# Patient Record
Sex: Female | Born: 1970 | Race: White | Hispanic: No | Marital: Married | Smoking: Never smoker
Health system: Southern US, Community
[De-identification: ages and names within clinical notes are randomized; demographics above are authoritative.]

## PROBLEM LIST (undated history)

## (undated) DIAGNOSIS — N870 Mild cervical dysplasia: Secondary | ICD-10-CM

## (undated) DIAGNOSIS — C50919 Malignant neoplasm of unspecified site of unspecified female breast: Secondary | ICD-10-CM

## (undated) DIAGNOSIS — E079 Disorder of thyroid, unspecified: Secondary | ICD-10-CM

## (undated) DIAGNOSIS — R7989 Other specified abnormal findings of blood chemistry: Secondary | ICD-10-CM

## (undated) DIAGNOSIS — T7840XA Allergy, unspecified, initial encounter: Secondary | ICD-10-CM

## (undated) DIAGNOSIS — Z923 Personal history of irradiation: Secondary | ICD-10-CM

## (undated) HISTORY — PX: FRACTURE SURGERY: SHX138

## (undated) HISTORY — PX: COLPOSCOPY: SHX161

## (undated) HISTORY — PX: OTHER SURGICAL HISTORY: SHX169

## (undated) HISTORY — PX: COLONOSCOPY: SHX174

## (undated) HISTORY — DX: Mild cervical dysplasia: N87.0

## (undated) HISTORY — DX: Disorder of thyroid, unspecified: E07.9

## (undated) HISTORY — PX: WISDOM TOOTH EXTRACTION: SHX21

## (undated) HISTORY — PX: BREAST BIOPSY: SHX20

## (undated) HISTORY — DX: Other specified abnormal findings of blood chemistry: R79.89

---

## 1998-12-31 ENCOUNTER — Inpatient Hospital Stay (HOSPITAL_COMMUNITY): Admission: AD | Admit: 1998-12-31 | Discharge: 1999-01-04 | Payer: Self-pay | Admitting: Obstetrics and Gynecology

## 2000-11-16 HISTORY — PX: ACETABULUM FRACTURE SURGERY: SHX541

## 2001-11-23 ENCOUNTER — Encounter: Admission: RE | Admit: 2001-11-23 | Discharge: 2001-12-16 | Payer: Self-pay | Admitting: Orthopedic Surgery

## 2002-03-02 ENCOUNTER — Other Ambulatory Visit: Admission: RE | Admit: 2002-03-02 | Discharge: 2002-03-02 | Payer: Self-pay | Admitting: Obstetrics and Gynecology

## 2002-03-31 ENCOUNTER — Ambulatory Visit (HOSPITAL_COMMUNITY)
Admission: RE | Admit: 2002-03-31 | Discharge: 2002-03-31 | Payer: Self-pay | Admitting: Physical Medicine and Rehabilitation

## 2003-03-20 ENCOUNTER — Other Ambulatory Visit: Admission: RE | Admit: 2003-03-20 | Discharge: 2003-03-20 | Payer: Self-pay | Admitting: Obstetrics & Gynecology

## 2004-03-24 ENCOUNTER — Other Ambulatory Visit: Admission: RE | Admit: 2004-03-24 | Discharge: 2004-03-24 | Payer: Self-pay | Admitting: Obstetrics & Gynecology

## 2007-06-15 DIAGNOSIS — N871 Moderate cervical dysplasia: Secondary | ICD-10-CM | POA: Insufficient documentation

## 2007-06-15 HISTORY — DX: Moderate cervical dysplasia: N87.1

## 2007-07-25 ENCOUNTER — Other Ambulatory Visit: Admission: RE | Admit: 2007-07-25 | Discharge: 2007-07-25 | Payer: Self-pay | Admitting: Obstetrics and Gynecology

## 2008-01-25 ENCOUNTER — Other Ambulatory Visit: Admission: RE | Admit: 2008-01-25 | Discharge: 2008-01-25 | Payer: Self-pay | Admitting: Obstetrics and Gynecology

## 2009-01-09 ENCOUNTER — Ambulatory Visit: Payer: Self-pay | Admitting: Obstetrics and Gynecology

## 2009-01-09 ENCOUNTER — Other Ambulatory Visit: Admission: RE | Admit: 2009-01-09 | Discharge: 2009-01-09 | Payer: Self-pay | Admitting: Obstetrics and Gynecology

## 2009-01-09 ENCOUNTER — Encounter: Payer: Self-pay | Admitting: Obstetrics and Gynecology

## 2009-01-16 ENCOUNTER — Ambulatory Visit: Payer: Self-pay | Admitting: Obstetrics and Gynecology

## 2009-02-27 ENCOUNTER — Ambulatory Visit: Payer: Self-pay | Admitting: Obstetrics and Gynecology

## 2009-12-25 ENCOUNTER — Ambulatory Visit: Payer: Self-pay | Admitting: Obstetrics and Gynecology

## 2010-01-03 ENCOUNTER — Ambulatory Visit: Payer: Self-pay | Admitting: Obstetrics and Gynecology

## 2010-01-13 ENCOUNTER — Ambulatory Visit: Payer: Self-pay | Admitting: Obstetrics and Gynecology

## 2010-01-13 ENCOUNTER — Other Ambulatory Visit: Admission: RE | Admit: 2010-01-13 | Discharge: 2010-01-13 | Payer: Self-pay | Admitting: Obstetrics and Gynecology

## 2010-04-17 ENCOUNTER — Ambulatory Visit (HOSPITAL_COMMUNITY): Admission: RE | Admit: 2010-04-17 | Discharge: 2010-04-17 | Payer: Self-pay | Admitting: Obstetrics and Gynecology

## 2011-01-19 ENCOUNTER — Encounter (INDEPENDENT_AMBULATORY_CARE_PROVIDER_SITE_OTHER): Payer: BC Managed Care – PPO | Admitting: Obstetrics and Gynecology

## 2011-01-19 ENCOUNTER — Other Ambulatory Visit: Payer: Self-pay | Admitting: Obstetrics and Gynecology

## 2011-01-19 ENCOUNTER — Other Ambulatory Visit (HOSPITAL_COMMUNITY)
Admission: RE | Admit: 2011-01-19 | Discharge: 2011-01-19 | Disposition: A | Discharge status: Home / Self Care | Payer: BC Managed Care – PPO | Source: Ambulatory Visit | Attending: Obstetrics and Gynecology | Admitting: Obstetrics and Gynecology

## 2011-01-19 DIAGNOSIS — Z01419 Encounter for gynecological examination (general) (routine) without abnormal findings: Secondary | ICD-10-CM

## 2011-01-19 DIAGNOSIS — Z13220 Encounter for screening for lipoid disorders: Secondary | ICD-10-CM

## 2011-01-19 DIAGNOSIS — Z124 Encounter for screening for malignant neoplasm of cervix: Secondary | ICD-10-CM | POA: Insufficient documentation

## 2012-01-19 ENCOUNTER — Encounter: Payer: Self-pay | Admitting: Gynecology

## 2012-01-19 DIAGNOSIS — N870 Mild cervical dysplasia: Secondary | ICD-10-CM | POA: Insufficient documentation

## 2012-01-26 ENCOUNTER — Other Ambulatory Visit (HOSPITAL_COMMUNITY)
Admission: RE | Admit: 2012-01-26 | Discharge: 2012-01-26 | Disposition: A | Discharge status: Home / Self Care | Payer: BC Managed Care – PPO | Source: Ambulatory Visit | Attending: Obstetrics and Gynecology | Admitting: Obstetrics and Gynecology

## 2012-01-26 ENCOUNTER — Encounter: Payer: Self-pay | Admitting: Obstetrics and Gynecology

## 2012-01-26 ENCOUNTER — Ambulatory Visit (INDEPENDENT_AMBULATORY_CARE_PROVIDER_SITE_OTHER): Payer: BC Managed Care – PPO | Admitting: Obstetrics and Gynecology

## 2012-01-26 VITALS — BP 120/80 | Ht 66.0 in | Wt 182.0 lb

## 2012-01-26 DIAGNOSIS — Z01419 Encounter for gynecological examination (general) (routine) without abnormal findings: Secondary | ICD-10-CM

## 2012-01-26 LAB — LIPID PANEL
Cholesterol: 193 mg/dL (ref 0–200)
HDL: 74 mg/dL (ref >39)
LDL Cholesterol: 104 mg/dL — ABNORMAL HIGH (ref 0–99)
Total CHOL/HDL Ratio: 2.6 Ratio
Triglycerides: 76 mg/dL (ref <150)
VLDL: 15 mg/dL (ref 0–40)

## 2012-01-26 MED ORDER — AMOXICILLIN 500 MG PO CAPS: 500.0000 mg | ORAL_CAPSULE | Freq: Two times a day (BID) | ORAL | Status: AC

## 2012-01-26 NOTE — Progress Notes (Addendum)
Patient came to see me today for her annual GYN exam. Her Paps have remained normal since her last colposcopy. She will have just an occasional hot flash. She has a Mirena IUD in his having extremely light spotting. She is due for followup mammogram. She is having no pelvic pain or other GYN symptoms. Last year her LDL was borderline at 127. She fasted today. Patient is complaining of sinusitis with a lot of drainage.  Physical examination: Kennon Portela present. HEENT within normal limits. Neck: Thyroid not large. No masses. Supraclavicular nodes: not enlarged. Breasts: Examined in both sitting midline position. No skin changes and no masses. Abdomen: Soft no guarding rebound or masses or hernia. Pelvic: External: Within normal limits. BUS: Within normal limits. Vaginal:within normal limits. Good estrogen effect. No evidence of cystocele rectocele or enterocele. Cervix: clean IUD string visible. Uterus: Normal size and shape. Adnexa: No masses. Rectovaginal exam: Confirmatory and negative. Extremities: Within normal limits.  Assessment: #1. CIN-1 #2. Very occasional hot flashes #3. Borderline LDL #4. Sinusitis  Plan: Mammogram. Reassured about hot flashes. Lipid  Profile. Amoxicillin 500 mg twice a day for 10 days(this usually works for patient)

## 2012-01-27 LAB — URINALYSIS W MICROSCOPIC + REFLEX CULTURE
Bacteria, UA: NONE SEEN
Bilirubin Urine: NEGATIVE
Casts: NONE SEEN
Crystals: NONE SEEN
Glucose, UA: NEGATIVE mg/dL
Hgb urine dipstick: NEGATIVE
Ketones, ur: NEGATIVE mg/dL
Leukocytes, UA: NEGATIVE
Nitrite: NEGATIVE
Protein, ur: NEGATIVE mg/dL
Specific Gravity, Urine: 1.012 (ref 1.005–1.030)
Squamous Epithelial / LPF: NONE SEEN
Urobilinogen, UA: 0.2 mg/dL (ref 0.0–1.0)
pH: 8 (ref 5.0–8.0)

## 2012-01-27 LAB — CBC WITH DIFFERENTIAL/PLATELET
Basophils Absolute: 0 10*3/uL (ref 0.0–0.1)
Basophils Relative: 0 % (ref 0–1)
Eosinophils Absolute: 0.1 10*3/uL (ref 0.0–0.7)
Eosinophils Relative: 2 % (ref 0–5)
HCT: 38.1 % (ref 36.0–46.0)
Hemoglobin: 12.4 g/dL (ref 12.0–15.0)
Lymphocytes Relative: 47 % — ABNORMAL HIGH (ref 12–46)
Lymphs Abs: 2.6 10*3/uL (ref 0.7–4.0)
MCH: 30.6 pg (ref 26.0–34.0)
MCHC: 32.5 g/dL (ref 30.0–36.0)
MCV: 94.1 fL (ref 78.0–100.0)
Monocytes Absolute: 0.4 10*3/uL (ref 0.1–1.0)
Monocytes Relative: 7 % (ref 3–12)
Neutro Abs: 2.4 10*3/uL (ref 1.7–7.7)
Neutrophils Relative %: 44 % (ref 43–77)
Platelets: 330 10*3/uL (ref 150–400)
RBC: 4.05 MIL/uL (ref 3.87–5.11)
RDW: 12.5 % (ref 11.5–15.5)
WBC: 5.5 10*3/uL (ref 4.0–10.5)

## 2012-02-16 ENCOUNTER — Encounter: Payer: Self-pay | Admitting: Internal Medicine

## 2012-03-09 ENCOUNTER — Encounter: Payer: Self-pay | Admitting: Internal Medicine

## 2012-03-10 ENCOUNTER — Encounter: Payer: Self-pay | Admitting: Internal Medicine

## 2012-03-10 ENCOUNTER — Ambulatory Visit (INDEPENDENT_AMBULATORY_CARE_PROVIDER_SITE_OTHER): Payer: BC Managed Care – PPO | Admitting: Internal Medicine

## 2012-03-10 VITALS — BP 110/70 | HR 72 | Ht 66.5 in | Wt 180.4 lb

## 2012-03-10 DIAGNOSIS — Z1211 Encounter for screening for malignant neoplasm of colon: Secondary | ICD-10-CM

## 2012-03-10 DIAGNOSIS — Z800 Family history of malignant neoplasm of digestive organs: Secondary | ICD-10-CM | POA: Insufficient documentation

## 2012-03-10 MED ORDER — PEG-KCL-NACL-NASULF-NA ASC-C 100 G PO SOLR: 1.0000 | Freq: Once | ORAL | Status: DC

## 2012-03-10 NOTE — Progress Notes (Signed)
Subjective:    Patient ID: Dominique Murphy, female    DOB: 1971-03-22, 41 y.o.   MRN: [id number]  HPI Dominique Murphy is a 41 yo female with PMH of CIN-1 who is seen in consultation at the request of Dr. Eda Paschal for evaluation of colorectal cancer screening given her family history of colon cancer. The patient is asymptomatic today from a GI standpoint. No abdominal pain, nausea, vomiting. No trouble with heartburn, dysphagia or odynophagia. No change in appetite. No weight loss. No change in bowel habits. No bright red blood per rectum or melena. No diarrhea or constipation.  Her father was diagnosed and treated for colorectal cancer at age 38. He underwent chemotherapy and a segmental colon resection. He is currently doing well. Her sister was subsequently screened and did not have polyps  Review of Systems As per history of present illness, otherwise negative  Past Medical History  Diagnosis Date  . CIN I (cervical intraepithelial neoplasia I)    Past Surgical History  Procedure Date  . Acetabulum fracture surgery   . Colposcopy   . Cesarean section    Current Outpatient Prescriptions  Medication Sig Dispense Refill  . levonorgestrel (MIRENA) 20 MCG/24HR IUD 1 each by Intrauterine route once.      . Multiple Vitamin (MULTIVITAMIN) tablet Take 1 tablet by mouth daily.      . peg 3350 powder (MOVIPREP) SOLR Take 1 kit (100 g total) by mouth once.  1 kit  0   Allergies  Allergen Reactions  . Morphine And Related   . Percocet (Oxycodone-Acetaminophen)    Family History  Problem Relation Age of Onset  . Colon cancer Father   . Heart disease Maternal Grandfather    History  Substance Use Topics  . Smoking status: Never Smoker   . Smokeless tobacco: Never Used  . Alcohol Use: 1.0 oz/week    2 drink(s) per week      Objective:   Physical Exam BP 110/70  Pulse 72  Ht 5' 6.5" (1.689 m)  Wt 180 lb 6.4 oz (81.829 kg)  BMI 28.68 kg/m2 Constitutional: Well-developed and  well-nourished. No distress. HEENT: Normocephalic and atraumatic. Oropharynx is clear and moist. No oropharyngeal exudate. Conjunctivae are normal. Pupils are equal round and reactive to light. No scleral icterus. Neck: Neck supple. Trachea midline. Cardiovascular: Normal rate, regular rhythm and intact distal pulses. No M/R/G Pulmonary/chest: Effort normal and breath sounds normal. No wheezing, rales or rhonchi. Abdominal: Soft, nontender, nondistended. Bowel sounds active throughout. There are no masses palpable. No hepatosplenomegaly. Extremities: no clubbing, cyanosis, or edema Lymphadenopathy: No cervical adenopathy noted. Neurological: Alert and oriented to person place and time. Skin: Skin is warm and dry. No rashes noted. Psychiatric: Normal mood and affect. Behavior is normal.  CBC    Component Value Date/Time   WBC 5.5 01/26/2012 1222   RBC 4.05 01/26/2012 1222   HGB 12.4 01/26/2012 1222   HCT 38.1 01/26/2012 1222   PLT 330 01/26/2012 1222   MCV 94.1 01/26/2012 1222   MCH 30.6 01/26/2012 1222   MCHC 32.5 01/26/2012 1222   RDW 12.5 01/26/2012 1222   LYMPHSABS 2.6 01/26/2012 1222   MONOABS 0.4 01/26/2012 1222   EOSABS 0.1 01/26/2012 1222   BASOSABS 0.0 01/26/2012 1222      Assessment & Plan:   41 yo female with PMH of CIN-1 who is seen in consultation at the request of Dr. Eda Paschal for evaluation of colorectal cancer screening given her family history of colon cancer.  1. High risk CRC screening/family hx of colon cancer in 1st degree relative at age 11 -- based on the patient's family history of colorectal cancer in a first-degree relative at age 54, current guidelines support beginning screening 10 years before this. For her this is age 69, which means now. We discussed the test in detail including the risks and benefits, along with the preparations, and she is agreeable to proceed. We will prep with Moviprep.  Repeat screening will be every 5 years based on family history, according  to current guidelines, unless findings of her colonoscopy dictate a shorter interval. Time provided for questions and answers, and she is satisfied with this plan.

## 2012-03-10 NOTE — Patient Instructions (Addendum)
You have been scheduled for a colonoscopy with propofol. Please follow written instructions given to you at your visit today.  Please pick up your prep kit at the pharmacy within the next 1-3 days.   We have sent the following medications to your pharmacy for you to pick up at your convenience: moviprep, you received instructions today at your visit

## 2012-03-14 ENCOUNTER — Telehealth: Payer: Self-pay | Admitting: *Deleted

## 2012-03-14 NOTE — Telephone Encounter (Signed)
PT INFORMED WITH RECENT LAB RESULTS. 01/26/12

## 2012-04-01 ENCOUNTER — Telehealth: Payer: Self-pay | Admitting: *Deleted

## 2012-04-01 MED ORDER — FLUCONAZOLE 150 MG PO TABS: 150.0000 mg | ORAL_TABLET | Freq: Every day | ORAL | Status: AC

## 2012-04-01 NOTE — Telephone Encounter (Signed)
(  pt of Dr.G) Pt calling requesting new Rx for diflucan, pt said she has refills but Rx expired. C/o itching, white discharge only. Pt would like 3 day dose of medication. Please advise

## 2012-04-01 NOTE — Telephone Encounter (Signed)
rx sent pt informed 

## 2012-04-01 NOTE — Telephone Encounter (Signed)
Okay please call in Diflucan 150 by mouth daily for 3 days #3 no refills office visit if no relief. (I did not see any orders for Diflucan in the past, if she took Diflucan 100 daily for 3 days please call in Diflucan 100 daily for 3 days #3).

## 2012-04-05 ENCOUNTER — Encounter: Payer: Self-pay | Admitting: Internal Medicine

## 2012-04-05 ENCOUNTER — Ambulatory Visit (AMBULATORY_SURGERY_CENTER): Payer: BC Managed Care – PPO | Admitting: Internal Medicine

## 2012-04-05 VITALS — BP 121/70 | HR 73 | Temp 96.8°F | Resp 15 | Ht 66.5 in | Wt 180.0 lb

## 2012-04-05 DIAGNOSIS — Z1211 Encounter for screening for malignant neoplasm of colon: Secondary | ICD-10-CM

## 2012-04-05 DIAGNOSIS — Z800 Family history of malignant neoplasm of digestive organs: Secondary | ICD-10-CM

## 2012-04-05 MED ORDER — SODIUM CHLORIDE 0.9 % IV SOLN: 500.0000 mL | INTRAVENOUS | Status: DC

## 2012-04-05 NOTE — Patient Instructions (Signed)
YOU HAD AN ENDOSCOPIC PROCEDURE TODAY AT THE [facility]: Refer to the procedure report that was given to you for any specific questions about what was found during the examination.  If the procedure report does not answer your questions, please call your gastroenterologist to clarify.  If you requested that your care partner not be given the details of your procedure findings, then the procedure report has been included in a sealed envelope for you to review at your convenience later.  YOU SHOULD EXPECT: Some feelings of bloating in the abdomen. Passage of more gas than usual.  Walking can help get rid of the air that was put into your GI tract during the procedure and reduce the bloating. If you had a lower endoscopy (such as a colonoscopy or flexible sigmoidoscopy) you may notice spotting of blood in your stool or on the toilet paper. If you underwent a bowel prep for your procedure, then you may not have a normal bowel movement for a few days.  DIET: Your first meal following the procedure should be a light meal and then it is ok to progress to your normal diet.  A half-sandwich or bowl of soup is an example of a good first meal.  Heavy or fried foods are harder to digest and may make you feel nauseous or bloated.  Likewise meals heavy in dairy and vegetables can cause extra gas to form and this can also increase the bloating.  Drink plenty of fluids but you should avoid alcoholic beverages for 24 hours.  ACTIVITY: Your care partner should take you home directly after the procedure.  You should plan to take it easy, moving slowly for the rest of the day.  You can resume normal activity the day after the procedure however you should NOT DRIVE or use heavy machinery for 24 hours (because of the sedation medicines used during the test).    SYMPTOMS TO REPORT IMMEDIATELY: A gastroenterologist can be reached at any hour.  During normal business hours, 8:30 AM to 5:00 PM Monday through Friday,  call [phone].  After hours and on weekends, please call the GI answering service at [phone] who will take a message and have the physician on call contact you.   Following lower endoscopy (colonoscopy or flexible sigmoidoscopy):  Excessive amounts of blood in the stool  Significant tenderness or worsening of abdominal pains  Swelling of the abdomen that is new, acute  Fever of 100F or higher    FOLLOW UP: If any biopsies were taken you will be contacted by phone or by letter within the next 1-3 weeks.  Call your gastroenterologist if you have not heard about the biopsies in 3 weeks.  Our staff will call the home number listed on your records the next business day following your procedure to check on you and address any questions or concerns that you may have at that time regarding the information given to you following your procedure. This is a courtesy call and so if there is no answer at the home number and we have not heard from you through the emergency physician on call, we will assume that you have returned to your regular daily activities without incident.  SIGNATURES/CONFIDENTIALITY: You and/or your care partner have signed paperwork which will be entered into your electronic medical record.  These signatures attest to the fact that that the information above on your After Visit Summary has been reviewed and is understood.  Full responsibility of the confidentiality   of this discharge information lies with you and/or your care-partner.     

## 2012-04-05 NOTE — Op Note (Signed)
[facility] [address] [place], [place]  [address]  COLONOSCOPY PROCEDURE REPORT  PATIENT:  Dominique Murphy, Dominique Murphy  MR#:  [id number] BIRTHDATE:  31-Oct-1971, 41 yrs. old  GENDER:  female ENDOSCOPIST:  Carie Caddy. Rutherford Alarie, MD REF. BY:  Catha Gosselin, M.D. PROCEDURE DATE:  04/05/2012 PROCEDURE:  Colonoscopy 65784 ASA CLASS:  Class I INDICATIONS:  Elevated Risk Screening, family history of colon cancer in father, age 42, 1st colonoscopy MEDICATIONS:   MAC sedation, administered by CRNA, propofol (Diprivan) 320 mg IV  DESCRIPTION OF PROCEDURE:   After the risks benefits and alternatives of the procedure were thoroughly explained, informed consent was obtained.  Digital rectal exam was performed and revealed no rectal masses.   The LB CF-[id number] endoscope was introduced through the anus and advanced to the terminal ileum which was intubated for a short distance, without limitations. The quality of the prep was Suprep good.  The instrument was then slowly withdrawn as the colon was fully examined. <<PROCEDUREIMAGES>>  FINDINGS:  The terminal ileum appeared normal.  A normal appearing cecum, ileocecal valve, and appendiceal orifice were identified. The ascending, hepatic flexure, transverse, splenic flexure, descending, sigmoid colon, and rectum appeared unremarkable. Retroflexed views in the rectum revealed no abnormalities.  The scope was then withdrawn from the cecum and the procedure completed.  COMPLICATIONS:  None  ENDOSCOPIC IMPRESSION: 1) Normal terminal ileum 2) Normal colon  RECOMMENDATIONS: 1) Given your significant family history of colon cancer, you should have a repeat colonoscopy in 5 years  Pinchus Weckwerth M. Rhea Belton, MD  CC:  Catha Gosselin, MD The Patient  n. eSIGNEDCarie Caddy. Amanda Pote at 04/05/2012 10:25 AM  Lenord Fellers, [phone]

## 2012-04-05 NOTE — Progress Notes (Signed)
Patient did not have preoperative order for IV antibiotic SSI prophylaxis. (G8918)Patient did not have preoperative order for IV antibiotic SSI prophylaxis. (G8918)Patient did not experience any of the following events: a burn prior to discharge; a fall within the facility; wrong site/side/patient/procedure/implant event; or a hospital transfer or hospital admission upon discharge from the facility. (G8907) 

## 2012-04-06 ENCOUNTER — Telehealth: Payer: Self-pay | Admitting: *Deleted

## 2012-04-06 NOTE — Telephone Encounter (Signed)
  Follow up Call-  Call back number 04/05/2012  Post procedure Call Back phone  # [phone]  Permission to leave phone message Yes     Patient questions:  Do you have a fever, pain , or abdominal swelling? no Pain Score  0 *  Have you tolerated food without any problems? yes  Have you been able to return to your normal activities? yes  Do you have any questions about your discharge instructions: Diet   no Medications  no Follow up visit  no  Do you have questions or concerns about your Care? no  Actions: * If pain score is 4 or above: No action needed, pain <4.

## 2012-05-11 ENCOUNTER — Telehealth: Payer: Self-pay | Admitting: *Deleted

## 2012-05-11 MED ORDER — ZOLPIDEM TARTRATE 10 MG PO TABS: 10.0000 mg | ORAL_TABLET | Freq: Every evening | ORAL | Status: DC | PRN

## 2012-05-11 NOTE — Addendum Note (Signed)
Addended by: Aura Camps on: 05/11/2012 04:21 PM   Modules accepted: Orders

## 2012-05-11 NOTE — Telephone Encounter (Signed)
Pt called requesting rx to help her sleep at night, she sleeps very light and her husband snores very loud. I asked pt about her pcp doctor and pt said that you normally write rx's for her. Please advise

## 2012-05-11 NOTE — Telephone Encounter (Signed)
Pt informed, rx called in 

## 2012-05-11 NOTE — Telephone Encounter (Signed)
Ambien 10 mg tablets, number 30. 1 at bedtime when necessary.

## 2012-07-04 ENCOUNTER — Other Ambulatory Visit: Payer: Self-pay | Admitting: *Deleted

## 2012-07-04 MED ORDER — ZOLPIDEM TARTRATE 10 MG PO TABS: 10.0000 mg | ORAL_TABLET | Freq: Every evening | ORAL | Status: DC | PRN

## 2012-09-09 ENCOUNTER — Other Ambulatory Visit: Payer: Self-pay | Admitting: Obstetrics and Gynecology

## 2012-09-12 NOTE — Telephone Encounter (Signed)
Rx called in to pharmacy. 

## 2012-12-07 ENCOUNTER — Other Ambulatory Visit: Payer: Self-pay | Admitting: Women's Health

## 2012-12-07 DIAGNOSIS — Z1231 Encounter for screening mammogram for malignant neoplasm of breast: Secondary | ICD-10-CM

## 2012-12-15 ENCOUNTER — Ambulatory Visit (HOSPITAL_COMMUNITY): Payer: BC Managed Care – PPO

## 2012-12-29 ENCOUNTER — Ambulatory Visit (HOSPITAL_COMMUNITY): Payer: BC Managed Care – PPO

## 2013-01-03 ENCOUNTER — Ambulatory Visit (HOSPITAL_COMMUNITY)
Admission: RE | Admit: 2013-01-03 | Discharge: 2013-01-03 | Disposition: A | Discharge status: Home / Self Care | Payer: BC Managed Care – PPO | Source: Ambulatory Visit | Attending: Women's Health | Admitting: Women's Health

## 2013-01-03 DIAGNOSIS — Z1231 Encounter for screening mammogram for malignant neoplasm of breast: Secondary | ICD-10-CM

## 2013-01-26 ENCOUNTER — Ambulatory Visit: Payer: BC Managed Care – PPO | Admitting: Women's Health

## 2013-01-26 ENCOUNTER — Encounter: Payer: Self-pay | Admitting: Women's Health

## 2013-01-26 ENCOUNTER — Other Ambulatory Visit (HOSPITAL_COMMUNITY)
Admission: RE | Admit: 2013-01-26 | Discharge: 2013-01-26 | Disposition: A | Discharge status: Home / Self Care | Payer: BC Managed Care – PPO | Source: Ambulatory Visit | Attending: Obstetrics and Gynecology | Admitting: Obstetrics and Gynecology

## 2013-01-26 VITALS — BP 116/70 | Ht 67.0 in | Wt 181.0 lb

## 2013-01-26 DIAGNOSIS — N870 Mild cervical dysplasia: Secondary | ICD-10-CM

## 2013-01-26 DIAGNOSIS — Z833 Family history of diabetes mellitus: Secondary | ICD-10-CM

## 2013-01-26 DIAGNOSIS — Z01419 Encounter for gynecological examination (general) (routine) without abnormal findings: Secondary | ICD-10-CM

## 2013-01-26 DIAGNOSIS — G4700 Insomnia, unspecified: Secondary | ICD-10-CM

## 2013-01-26 DIAGNOSIS — Z13220 Encounter for screening for lipoid disorders: Secondary | ICD-10-CM

## 2013-01-26 DIAGNOSIS — Z800 Family history of malignant neoplasm of digestive organs: Secondary | ICD-10-CM

## 2013-01-26 LAB — CBC WITH DIFFERENTIAL/PLATELET
Basophils Absolute: 0 10*3/uL (ref 0.0–0.1)
Basophils Relative: 0 % (ref 0–1)
Eosinophils Absolute: 0.1 10*3/uL (ref 0.0–0.7)
Eosinophils Relative: 1 % (ref 0–5)
HCT: 37.6 % (ref 36.0–46.0)
Hemoglobin: 12.6 g/dL (ref 12.0–15.0)
Lymphocytes Relative: 37 % (ref 12–46)
Lymphs Abs: 2.3 10*3/uL (ref 0.7–4.0)
MCH: 30.4 pg (ref 26.0–34.0)
MCHC: 33.5 g/dL (ref 30.0–36.0)
MCV: 90.8 fL (ref 78.0–100.0)
Monocytes Absolute: 0.6 10*3/uL (ref 0.1–1.0)
Monocytes Relative: 9 % (ref 3–12)
Neutro Abs: 3.3 10*3/uL (ref 1.7–7.7)
Neutrophils Relative %: 53 % (ref 43–77)
Platelets: 301 10*3/uL (ref 150–400)
RBC: 4.14 MIL/uL (ref 3.87–5.11)
RDW: 13.4 % (ref 11.5–15.5)
WBC: 6.3 10*3/uL (ref 4.0–10.5)

## 2013-01-26 MED ORDER — ZOLPIDEM TARTRATE 10 MG PO TABS: 10.0000 mg | ORAL_TABLET | Freq: Every evening | ORAL | Status: DC | PRN

## 2013-01-26 NOTE — Assessment & Plan Note (Signed)
Arline had negative colonoscopy 03/2012

## 2013-01-26 NOTE — Progress Notes (Signed)
Dominique Murphy October 04, 1971 [id number]    History:    The patient presents for annual exam.  Rare light cycle Mirena IUD placed 01/2009. CIN-1 on C&B 2008 with negative HR HPV, normal Paps after. Negative colonoscopy 03/2012. Father diagnosed with colon cancer age 43. Normal mammogram history.   Past medical history, past surgical history, family history and social history were all reviewed and documented in the EPIC chart. 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   ROS:  A  ROS was performed and pertinent positives and negatives are included in the history.  Exam:  Filed Vitals:   01/26/13 0931  BP: 116/70    General appearance:  Normal Head/Neck:  Normal, without cervical or supraclavicular adenopathy. Thyroid:  Symmetrical, normal in size, without palpable masses or nodularity. Respiratory  Effort:  Normal  Auscultation:  Clear without wheezing or rhonchi Cardiovascular  Auscultation:  Regular rate, without rubs, murmurs or gallops  Edema/varicosities:  Not grossly evident Abdominal  Soft,nontender, without masses, guarding or rebound.  Liver/spleen:  No organomegaly noted  Hernia:  None appreciated  Skin  Inspection:  Grossly normal  Palpation:  Grossly normal Neurologic/psychiatric  Orientation:  Normal with appropriate conversation.  Mood/affect:  Normal  Genitourinary    Breasts: Examined lying and sitting.     Right: Without masses, retractions, discharge or axillary adenopathy.     Left: Without masses, retractions, discharge or axillary adenopathy.   Inguinal/mons:  Normal without inguinal adenopathy  External genitalia:  Normal  BUS/Urethra/Skene's glands:  Normal  Bladder:  Normal  Vagina:  Normal  Cervix:  Normal IUD string visible  Uterus:   normal in size, shape and contour.  Midline and mobile  Adnexa/parametria:     Rt: Without masses or tenderness.   Lt: Without masses or tenderness.  Anus and perineum: Normal  Digital rectal exam: Normal  sphincter tone without palpated masses or tenderness  Assessment/Plan:  42 y.o. M. WF G1 P1 for annual exam with no complaints.   Mirena IUD 01/2009 CIN-1 2008 normal Paps after   Plan: SBE's, continue annual mammogram, calcium rich diet, vitamin D 1000 daily and increase regular exercise. CBC, glucose, lipid panel, UA, Pap. New screening guidelines reviewed. Aware Mirena IUD good for 5 years.    Harrington Challenger WHNP, 1:27 PM 01/26/2013

## 2013-01-26 NOTE — Patient Instructions (Addendum)

## 2013-01-27 ENCOUNTER — Encounter: Payer: Self-pay | Admitting: Obstetrics and Gynecology

## 2013-01-27 LAB — URINALYSIS W MICROSCOPIC + REFLEX CULTURE
Bacteria, UA: NONE SEEN
Bilirubin Urine: NEGATIVE
Casts: NONE SEEN
Crystals: NONE SEEN
Glucose, UA: NEGATIVE mg/dL
Hgb urine dipstick: NEGATIVE
Ketones, ur: NEGATIVE mg/dL
Leukocytes, UA: NEGATIVE
Nitrite: NEGATIVE
Protein, ur: NEGATIVE mg/dL
Specific Gravity, Urine: 1.029 (ref 1.005–1.030)
Squamous Epithelial / LPF: NONE SEEN
Urobilinogen, UA: 0.2 mg/dL (ref 0.0–1.0)
pH: 5.5 (ref 5.0–8.0)

## 2013-01-27 LAB — GLUCOSE, RANDOM: Glucose, Bld: 88 mg/dL (ref 70–99)

## 2013-01-27 LAB — LIPID PANEL
Cholesterol: 208 mg/dL — ABNORMAL HIGH (ref 0–200)
HDL: 77 mg/dL (ref >39)
LDL Cholesterol: 107 mg/dL — ABNORMAL HIGH (ref 0–99)
Total CHOL/HDL Ratio: 2.7 Ratio
Triglycerides: 118 mg/dL (ref <150)
VLDL: 24 mg/dL (ref 0–40)

## 2013-01-30 ENCOUNTER — Telehealth: Payer: Self-pay | Admitting: *Deleted

## 2013-01-30 NOTE — Telephone Encounter (Signed)
Prior authorization faxed to [employer],

## 2013-01-31 NOTE — Telephone Encounter (Signed)
Prior authorization was denied by [employer]. Pharmacy informed with this information.

## 2013-02-14 DIAGNOSIS — N870 Mild cervical dysplasia: Secondary | ICD-10-CM

## 2013-02-14 HISTORY — DX: Mild cervical dysplasia: N87.0

## 2013-03-01 ENCOUNTER — Ambulatory Visit (INDEPENDENT_AMBULATORY_CARE_PROVIDER_SITE_OTHER): Payer: BC Managed Care – PPO | Admitting: Gynecology

## 2013-03-01 ENCOUNTER — Other Ambulatory Visit: Payer: Self-pay | Admitting: Gynecology

## 2013-03-01 ENCOUNTER — Encounter: Payer: Self-pay | Admitting: Gynecology

## 2013-03-01 DIAGNOSIS — R6889 Other general symptoms and signs: Secondary | ICD-10-CM

## 2013-03-01 DIAGNOSIS — IMO0002 Reserved for concepts with insufficient information to code with codable children: Secondary | ICD-10-CM

## 2013-03-01 NOTE — Patient Instructions (Signed)
Office will call you with biopsy results 

## 2013-03-01 NOTE — Progress Notes (Signed)
Patient ID: Dominique Murphy, female   DOB: 1971-05-31, 42 y.o.   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 Was followed expectantly with normal Pap smears following. Most recent Pap smear at her annual exam 01/2013 showed LGSIL.  Exam with Selena Batten assistant External BUS vagina normal. Cervix grossly normal with IUD string visible, on the longer side. Uterus normal size midline mobile nontender. Adnexa without masses or tenderness.  Colposcopic evaluation, acetic acid cleanse inadequate with 2 patches of the acetowhite change off of the transformation zone 11 to 12:00.  TZ not seen.  Represent a biopsy at 12:00 taken with endocervical sample with brush taken separately. Physical Exam  Genitourinary:      Assessment and plan: LGSIL Pap smear, colposcopy isn't adequate with 2 areas of acetowhite change at 11 to 12:00. Represent a biopsy taken, endocervical sample taken. Patient will follow up for results. Had long discussion about dysplasia, high-grade/low-grade, progression/regression, HPV relationship. Plan expectant management if low grade or normal results. IUD string on the long side. Patient's asymptomatic without issues with intercourse. Light to absent menses.  Discussed trimming or not and we both agree on leaving this alone as she is asymptomatic from this. Followup for biopsy results.

## 2013-03-02 ENCOUNTER — Encounter: Payer: Self-pay | Admitting: Gynecology

## 2013-03-09 ENCOUNTER — Telehealth: Payer: Self-pay | Admitting: *Deleted

## 2013-03-09 MED ORDER — FLUCONAZOLE 150 MG PO TABS: 150.0000 mg | ORAL_TABLET | Freq: Once | ORAL | Status: DC

## 2013-03-09 NOTE — Telephone Encounter (Signed)
Okay to E. Scribe Diflucan 150 times one dose with 1 refill, instructed to call if problems.

## 2013-03-09 NOTE — Telephone Encounter (Signed)
Pt informed with the below note, rx sent. 

## 2013-03-09 NOTE — Telephone Encounter (Signed)
Pt called requesting a dose of diflucan, pt had oral surgery yesterday, taking antibody and when doing this yeast infection will occur. Okay to send Rx?

## 2013-08-02 ENCOUNTER — Other Ambulatory Visit: Payer: Self-pay | Admitting: Women's Health

## 2013-08-02 NOTE — Telephone Encounter (Signed)
Called into pharmacy

## 2013-09-21 ENCOUNTER — Other Ambulatory Visit: Payer: Self-pay

## 2013-11-22 ENCOUNTER — Other Ambulatory Visit: Payer: Self-pay | Admitting: Women's Health

## 2013-11-22 NOTE — Telephone Encounter (Signed)
Called into pharmacy

## 2013-12-13 ENCOUNTER — Other Ambulatory Visit: Payer: Self-pay | Admitting: Gynecology

## 2013-12-13 DIAGNOSIS — Z1231 Encounter for screening mammogram for malignant neoplasm of breast: Secondary | ICD-10-CM

## 2014-01-11 ENCOUNTER — Ambulatory Visit (HOSPITAL_COMMUNITY): Payer: BC Managed Care – PPO

## 2014-01-17 ENCOUNTER — Ambulatory Visit (HOSPITAL_COMMUNITY): Payer: BC Managed Care – PPO

## 2014-01-19 ENCOUNTER — Ambulatory Visit (HOSPITAL_COMMUNITY)
Admission: RE | Admit: 2014-01-19 | Discharge: 2014-01-19 | Disposition: A | Discharge status: Home / Self Care | Payer: BC Managed Care – PPO | Source: Ambulatory Visit | Attending: Gynecology | Admitting: Gynecology

## 2014-01-19 DIAGNOSIS — Z1231 Encounter for screening mammogram for malignant neoplasm of breast: Secondary | ICD-10-CM | POA: Insufficient documentation

## 2014-01-31 ENCOUNTER — Ambulatory Visit (INDEPENDENT_AMBULATORY_CARE_PROVIDER_SITE_OTHER): Payer: BC Managed Care – PPO | Admitting: Gynecology

## 2014-01-31 ENCOUNTER — Other Ambulatory Visit (HOSPITAL_COMMUNITY)
Admission: RE | Admit: 2014-01-31 | Discharge: 2014-01-31 | Disposition: A | Discharge status: Home / Self Care | Payer: BC Managed Care – PPO | Source: Ambulatory Visit | Attending: Gynecology | Admitting: Gynecology

## 2014-01-31 ENCOUNTER — Encounter: Payer: Self-pay | Admitting: Gynecology

## 2014-01-31 VITALS — BP 110/76 | Ht 66.0 in | Wt 188.0 lb

## 2014-01-31 DIAGNOSIS — IMO0002 Reserved for concepts with insufficient information to code with codable children: Secondary | ICD-10-CM

## 2014-01-31 DIAGNOSIS — N898 Other specified noninflammatory disorders of vagina: Secondary | ICD-10-CM

## 2014-01-31 DIAGNOSIS — Z30431 Encounter for routine checking of intrauterine contraceptive device: Secondary | ICD-10-CM

## 2014-01-31 DIAGNOSIS — R6889 Other general symptoms and signs: Secondary | ICD-10-CM

## 2014-01-31 DIAGNOSIS — Z1151 Encounter for screening for human papillomavirus (HPV): Secondary | ICD-10-CM | POA: Insufficient documentation

## 2014-01-31 DIAGNOSIS — N899 Noninflammatory disorder of vagina, unspecified: Secondary | ICD-10-CM

## 2014-01-31 DIAGNOSIS — Z124 Encounter for screening for malignant neoplasm of cervix: Secondary | ICD-10-CM | POA: Insufficient documentation

## 2014-01-31 DIAGNOSIS — Z01419 Encounter for gynecological examination (general) (routine) without abnormal findings: Secondary | ICD-10-CM

## 2014-01-31 LAB — WET PREP FOR TRICH, YEAST, CLUE
Trich, Wet Prep: NONE SEEN
Yeast Wet Prep HPF POC: NONE SEEN

## 2014-01-31 LAB — COMPREHENSIVE METABOLIC PANEL
ALT: 19 U/L (ref 0–35)
AST: 26 U/L (ref 0–37)
Albumin: 4.4 g/dL (ref 3.5–5.2)
Alkaline Phosphatase: 66 U/L (ref 39–117)
BUN: 18 mg/dL (ref 6–23)
CO2: 27 mEq/L (ref 19–32)
Calcium: 9.2 mg/dL (ref 8.4–10.5)
Chloride: 103 mEq/L (ref 96–112)
Creat: 0.76 mg/dL (ref 0.50–1.10)
Glucose, Bld: 94 mg/dL (ref 70–99)
Potassium: 4.4 mEq/L (ref 3.5–5.3)
Sodium: 138 mEq/L (ref 135–145)
Total Bilirubin: 0.3 mg/dL (ref 0.2–1.2)
Total Protein: 7 g/dL (ref 6.0–8.3)

## 2014-01-31 LAB — LIPID PANEL
Cholesterol: 210 mg/dL — ABNORMAL HIGH (ref 0–200)
HDL: 72 mg/dL (ref >39)
LDL Cholesterol: 128 mg/dL — ABNORMAL HIGH (ref 0–99)
Total CHOL/HDL Ratio: 2.9 Ratio
Triglycerides: 51 mg/dL (ref <150)
VLDL: 10 mg/dL (ref 0–40)

## 2014-01-31 MED ORDER — TINIDAZOLE 500 MG PO TABS: 2.0000 g | ORAL_TABLET | Freq: Once | ORAL | Status: DC

## 2014-01-31 NOTE — Progress Notes (Signed)
Dominique Murphy [birth year] [id number]        43 y.o.  G1P1001 for annual exam.  Several issues noted below.  Past medical history,surgical history, problem list, medications, allergies, family history and social history were all reviewed and documented in the EPIC chart.  ROS:  Performed and pertinent positives and negatives are included in the history, assessment and plan .  Exam: Kim assistant Filed Vitals:   01/31/14 1011  BP: 110/76  Height: 5\' 6"  (1.676 m)  Weight: 188 lb (85.276 kg)   General appearance  Normal Skin grossly normal Head/Neck normal with no cervical or supraclavicular adenopathy thyroid normal Lungs  clear Cardiac RR, without RMG Abdominal  soft, nontender, without masses, organomegaly or hernia Breasts  examined lying and sitting without masses, retractions, discharge or axillary adenopathy. Pelvic  Ext/BUS/vagina normal  Cervix normal with IUD string visualized. Pap/HPV done  Uterus anteverted, normal size, shape and contour, midline and mobile nontender   Adnexa  Without masses or tenderness    Anus and perineum  Normal   Rectovaginal  Normal sphincter tone without palpated masses or tenderness.    Assessment/Plan:  43 y.o. G77P1001 female for annual exam amenorrheic, Mirena IUD.   1. Mirena IUD 01/2009. Due to be replaced now and patient is to schedule appointment to do so. I did review all contraceptive options with her to include pill patch ring nexplanon Depo-Provera IUDs and sterilization. The pros/cons, risks/benefits of each choice reviewed. Patient wants to replace her IUD and will schedule ASAP. 2. 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  Does have history of LGSIL 2008, followed expectantly with normal Pap smears in the intervening time. Pap/HPV done today. Triage based upon these results. 3. Mild vaginal irritation and odor. No significant discharge. Wet prep is negative. She took a Diflucan she had at home but did not seem to help. We'll cover  for low-grade bacterial vaginosis with Tindamax 2 g daily x2 days. Alcohol avoidance reviewed. Followup if symptoms persist or recur. 4. Mammography 01/2014. Continue with annual mammography. SBE monthly reviewed. 5. Colonoscopy 2013 noted. 6. Health maintenance. Baseline CBC comprehensive metabolic panel lipid profile urinalysis ordered. Followup for IUD replacement appointment and Pap smear results.   Note: This document was prepared with digital dictation and possible smart phrase technology. Any transcriptional errors that result from this process are unintentional.   Anastasio Auerbach MD, 11:00 AM 01/31/2014

## 2014-01-31 NOTE — Patient Instructions (Signed)
Followup for IUD replacement appointment.  You may obtain a copy of any labs that were done today by logging onto MyChart as outlined in the instructions provided with your AVS (after visit summary). The office will not call with normal lab results but certainly if there are any significant abnormalities then we will contact you.   Health Maintenance, Female A healthy lifestyle and preventative care can promote health and wellness.  Maintain regular health, dental, and eye exams.  Eat a healthy diet. Foods like vegetables, fruits, whole grains, low-fat dairy products, and lean protein foods contain the nutrients you need without too many calories. Decrease your intake of foods high in solid fats, added sugars, and salt. Get information about a proper diet from your caregiver, if necessary.  Regular physical exercise is one of the most important things you can do for your health. Most adults should get at least 150 minutes of moderate-intensity exercise (any activity that increases your heart rate and causes you to sweat) each week. In addition, most adults need muscle-strengthening exercises on 2 or more days a week.   Maintain a healthy weight. The body mass index (BMI) is a screening tool to identify possible weight problems. It provides an estimate of body fat based on height and weight. Your caregiver can help determine your BMI, and can help you achieve or maintain a healthy weight. For adults 20 years and older:  A BMI below 18.5 is considered underweight.  A BMI of 18.5 to 24.9 is normal.  A BMI of 25 to 29.9 is considered overweight.  A BMI of 30 and above is considered obese.  Maintain normal blood lipids and cholesterol by exercising and minimizing your intake of saturated fat. Eat a balanced diet with plenty of fruits and vegetables. Blood tests for lipids and cholesterol should begin at age 24 and be repeated every 5 years. If your lipid or cholesterol levels are high, you are  over 50, or you are a high risk for heart disease, you may need your cholesterol levels checked more frequently.Ongoing high lipid and cholesterol levels should be treated with medicines if diet and exercise are not effective.  If you smoke, find out from your caregiver how to quit. If you do not use tobacco, do not start.  Lung cancer screening is recommended for adults aged 60 80 years who are at high risk for developing lung cancer because of a history of smoking. Yearly low-dose computed tomography (CT) is recommended for people who have at least a 30-pack-year history of smoking and are a current smoker or have quit within the past 15 years. A pack year of smoking is smoking an average of 1 pack of cigarettes a day for 1 year (for example: 1 pack a day for 30 years or 2 packs a day for 15 years). Yearly screening should continue until the smoker has stopped smoking for at least 15 years. Yearly screening should also be stopped for people who develop a health problem that would prevent them from having lung cancer treatment.  If you are pregnant, do not drink alcohol. If you are breastfeeding, be very cautious about drinking alcohol. If you are not pregnant and choose to drink alcohol, do not exceed 1 drink per day. One drink is considered to be 12 ounces (355 mL) of beer, 5 ounces (148 mL) of wine, or 1.5 ounces (44 mL) of liquor.  Avoid use of street drugs. Do not share needles with anyone. Ask for help if you  need support or instructions about stopping the use of drugs.  High blood pressure causes heart disease and increases the risk of stroke. Blood pressure should be checked at least every 1 to 2 years. Ongoing high blood pressure should be treated with medicines, if weight loss and exercise are not effective.  If you are 82 to 44 years old, ask your caregiver if you should take aspirin to prevent strokes.  Diabetes screening involves taking a blood sample to check your fasting blood sugar  level. This should be done once every 3 years, after age 75, if you are within normal weight and without risk factors for diabetes. Testing should be considered at a younger age or be carried out more frequently if you are overweight and have at least 1 risk factor for diabetes.  Breast cancer screening is essential preventative care for women. You should practice "breast self-awareness." This means understanding the normal appearance and feel of your breasts and may include breast self-examination. Any changes detected, no matter how small, should be reported to a caregiver. Women in their 53s and 30s should have a clinical breast exam (CBE) by a caregiver as part of a regular health exam every 1 to 3 years. After age 50, women should have a CBE every year. Starting at age 49, women should consider having a mammogram (breast X-ray) every year. Women who have a family history of breast cancer should talk to their caregiver about genetic screening. Women at a high risk of breast cancer should talk to their caregiver about having an MRI and a mammogram every year.  Breast cancer gene (BRCA)-related cancer risk assessment is recommended for women who have family members with BRCA-related cancers. BRCA-related cancers include breast, ovarian, tubal, and peritoneal cancers. Having family members with these cancers may be associated with an increased risk for harmful changes (mutations) in the breast cancer genes BRCA1 and BRCA2. Results of the assessment will determine the need for genetic counseling and BRCA1 and BRCA2 testing.  The Pap test is a screening test for cervical cancer. Women should have a Pap test starting at age 64. Between ages 15 and 25, Pap tests should be repeated every 2 years. Beginning at age 25, you should have a Pap test every 3 years as long as the past 3 Pap tests have been normal. If you had a hysterectomy for a problem that was not cancer or a condition that could lead to cancer, then  you no longer need Pap tests. If you are between ages 58 and 31, and you have had normal Pap tests going back 10 years, you no longer need Pap tests. If you have had past treatment for cervical cancer or a condition that could lead to cancer, you need Pap tests and screening for cancer for at least 20 years after your treatment. If Pap tests have been discontinued, risk factors (such as a new sexual partner) need to be reassessed to determine if screening should be resumed. Some women have medical problems that increase the chance of getting cervical cancer. In these cases, your caregiver may recommend more frequent screening and Pap tests.  The human papillomavirus (HPV) test is an additional test that may be used for cervical cancer screening. The HPV test looks for the virus that can cause the cell changes on the cervix. The cells collected during the Pap test can be tested for HPV. The HPV test could be used to screen women aged 47 years and older, and should be  used in women of any age who have unclear Pap test results. After the age of 84, women should have HPV testing at the same frequency as a Pap test.  Colorectal cancer can be detected and often prevented. Most routine colorectal cancer screening begins at the age of 53 and continues through age 45. However, your caregiver may recommend screening at an earlier age if you have risk factors for colon cancer. On a yearly basis, your caregiver may provide home test kits to check for hidden blood in the stool. Use of a small camera at the end of a tube, to directly examine the colon (sigmoidoscopy or colonoscopy), can detect the earliest forms of colorectal cancer. Talk to your caregiver about this at age 40, when routine screening begins. Direct examination of the colon should be repeated every 5 to 10 years through age 86, unless early forms of pre-cancerous polyps or small growths are found.  Hepatitis C blood testing is recommended for all people born  from [birth year] through 1966 and any individual with known risks for hepatitis C.  Practice safe sex. Use condoms and avoid high-risk sexual practices to reduce the spread of sexually transmitted infections (STIs). Sexually active women aged 70 and younger should be checked for Chlamydia, which is a common sexually transmitted infection. Older women with new or multiple partners should also be tested for Chlamydia. Testing for other STIs is recommended if you are sexually active and at increased risk.  Osteoporosis is a disease in which the bones lose minerals and strength with aging. This can result in serious bone fractures. The risk of osteoporosis can be identified using a bone density scan. Women ages 56 and over and women at risk for fractures or osteoporosis should discuss screening with their caregivers. Ask your caregiver whether you should be taking a calcium supplement or vitamin D to reduce the rate of osteoporosis.  Menopause can be associated with physical symptoms and risks. Hormone replacement therapy is available to decrease symptoms and risks. You should talk to your caregiver about whether hormone replacement therapy is right for you.  Use sunscreen. Apply sunscreen liberally and repeatedly throughout the day. You should seek shade when your shadow is shorter than you. Protect yourself by wearing long sleeves, pants, a wide-brimmed hat, and sunglasses year round, whenever you are outdoors.  Notify your caregiver of new moles or changes in moles, especially if there is a change in shape or color. Also notify your caregiver if a mole is larger than the size of a pencil eraser.  Stay current with your immunizations. Document Released: 05/18/2011 Document Revised: 02/27/2013 Document Reviewed: 05/18/2011 [facility] Patient Information 2014 [place].

## 2014-02-01 ENCOUNTER — Encounter: Payer: Self-pay | Admitting: Gynecology

## 2014-02-01 LAB — URINALYSIS W MICROSCOPIC + REFLEX CULTURE
Bacteria, UA: NONE SEEN
Bilirubin Urine: NEGATIVE
Casts: NONE SEEN
Crystals: NONE SEEN
Glucose, UA: NEGATIVE mg/dL
Hgb urine dipstick: NEGATIVE
Ketones, ur: NEGATIVE mg/dL
Leukocytes, UA: NEGATIVE
Nitrite: NEGATIVE
Protein, ur: NEGATIVE mg/dL
Specific Gravity, Urine: 1.016 (ref 1.005–1.030)
Squamous Epithelial / LPF: NONE SEEN
Urobilinogen, UA: 0.2 mg/dL (ref 0.0–1.0)
pH: 5.5 (ref 5.0–8.0)

## 2014-02-02 ENCOUNTER — Telehealth: Payer: Self-pay

## 2014-02-02 NOTE — Telephone Encounter (Signed)
I contacted patient and she does feel like she needs some help with this so I provided her with Dominique Murphy's name/ph # so she can call for appt. She said she is coming back in to see Dr. Loetta Rough 02/20/14 and will mention this then.

## 2014-02-02 NOTE — Telephone Encounter (Signed)
Patient responded to her online after visit survey:  -How are you feeling after your recent visit? Fine   -Does the recommended course of treatment seem to be helping your symptoms? Just 1.5 days in. Hoping meds work  -Are you experiencing any side effects from your recommended treatment? No  -Is there anything else you would like to ask your physician? Yes. Forgot to mention my binge eating issues which may explain weight gso too. I don't know why this is happening. It's like I know I shouldn't. But I do and can't control it. :(   Please see patient's question on last one.

## 2014-02-02 NOTE — Telephone Encounter (Signed)
Check with her and see if this is a real issue. If so then she can be referred to counseling if she would like such as Marya Amsler at [employer] [facility]

## 2014-02-12 ENCOUNTER — Other Ambulatory Visit: Payer: Self-pay | Admitting: Gynecology

## 2014-02-12 ENCOUNTER — Telehealth: Payer: Self-pay | Admitting: Gynecology

## 2014-02-12 DIAGNOSIS — Z3049 Encounter for surveillance of other contraceptives: Secondary | ICD-10-CM

## 2014-02-12 MED ORDER — LEVONORGESTREL 20 MCG/24HR IU IUD: INTRAUTERINE_SYSTEM | Freq: Once | INTRAUTERINE | Status: DC

## 2014-02-12 NOTE — Telephone Encounter (Signed)
02/12/14-Pt was informed today that her [facility] ins will cover the removal of existing mIrena and insertion of new at 100%, no copay. Appt set for 02/20/14.[facility]

## 2014-02-20 ENCOUNTER — Encounter: Payer: Self-pay | Admitting: Gynecology

## 2014-02-20 ENCOUNTER — Ambulatory Visit (INDEPENDENT_AMBULATORY_CARE_PROVIDER_SITE_OTHER): Payer: BC Managed Care – PPO | Admitting: Gynecology

## 2014-02-20 DIAGNOSIS — Z30432 Encounter for removal of intrauterine contraceptive device: Secondary | ICD-10-CM

## 2014-02-20 DIAGNOSIS — Z30430 Encounter for insertion of intrauterine contraceptive device: Secondary | ICD-10-CM

## 2014-02-20 NOTE — Progress Notes (Signed)
Patient presents for Mirena IUD removal and replacement. She has read through the booklet, has no contraindications and signed the consent form.  I reviewed the removal and insertional process with her as well as the risks to include infection, either immediate or long-term, uterine perforation or migration requiring surgery to remove and possibility of failure with subsequent pregnancy.   The patient does request a paracervical block and she had a very bad experience with her last IUD placement causing a lot of discomfort and pain.  Exam with Kim assistant Pelvic: External BUS vagina normal. Cervix normal IUD strings visualized. Uterus anteverted normal size shape contour midline mobile nontender. Adnexa without masses or tenderness.  Procedure: The cervix was visualized with a speculum and a paracervical block using 1% lidocaine was placed, a total of 8 cc. The IUD strings were grasped and the [facility] forcep in her old Mirena IUD was removed, shown to her and discarded. The cervix was then cleansed with Betadine, anterior lip grasped with a single-tooth tenaculum, the uterus was sounded and a Mirena IUD was placed according to manufacturer's recommendations without difficulty. The strings were trimmed. The patient tolerated well and will follow up in one month for a postinsertional check.  Lot number:  [id number]  Note: This document was prepared with digital dictation and possible smart phrase technology. Any transcriptional errors that result from this process are unintentional.  Anastasio Auerbach MD, 4:32 PM 02/20/2014

## 2014-02-20 NOTE — Patient Instructions (Signed)
Intrauterine Device Insertion   Most often, an intrauterine device (IUD) is inserted into the uterus to prevent pregnancy. There are 2 types of IUDs available:  · Copper IUD This type of IUD creates an environment that is not favorable to sperm survival. The mechanism of action of the copper IUD is not known for certain. It can stay in place for 10 years.  · Hormone IUD This type of IUD contains the hormone progestin (synthetic progesterone). The progestin thickens the cervical mucus and prevents sperm from entering the uterus, and it also thins the uterine lining. There is no evidence that the hormone IUD prevents implantation. One hormone IUD can stay in place for up to 5 years, and a different hormone IUD can stay in place for up to 3 years.  An IUD is the most cost-effective birth control if left in place for the full duration. It may be removed at any time.  LET YOUR HEALTH CARE PROVIDER KNOW ABOUT:  · Any allergies you have.  · All medicines you are taking, including vitamins, herbs, eye drops, creams, and over-the-counter medicines.  · Previous problems you or members of your family have had with the use of anesthetics.  · Any blood disorders you have.  · Previous surgeries you have had.  · Possibility of pregnancy.  · Medical conditions you have.  RISKS AND COMPLICATIONS   Generally, intrauterine device insertion is a safe procedure. However, as with any procedure, complications can occur. Possible complications include:  · Accidental puncture (perforation) of the uterus.  · Accidental placement of the IUD either in the muscle layer of the uterus (myometrium) or outside the uterus. If this happens, the IUD can be found essentially floating around the bowels and must be taken out surgically.  · The IUD may fall out of the uterus (expulsion). This is more common in women who have recently had a child.    · Pregnancy in the fallopian tube (ectopic).  · Pelvic inflammatory disease (PID), which is infection of  the uterus and fallopian tubes. The risk of PID is slightly increased in the first 20 days after the IUD is placed, but the overall risk is still very low.  BEFORE THE PROCEDURE  · Schedule the IUD insertion for when you will have your menstrual period or right after, to make sure you are not pregnant. Placement of the IUD is better tolerated shortly after a menstrual cycle.  · You may need to take tests or be examined to make sure you are not pregnant.  · You may be required to take a pregnancy test.  · You may be required to get checked for sexually transmitted infections (STIs) prior to placement. Placing an IUD in someone who has an infection can make the infection worse.  · You may be given a pain reliever to take 1 or 2 hours before the procedure.  · An exam will be performed to determine the size and position of your uterus.  · Ask your health care provider about changing or stopping your regular medicines.  PROCEDURE   · A tool (speculum) is placed in the vagina. This allows your health care provider to see the lower part of the uterus (cervix).  · The cervix is prepped with a medicine that lowers the risk of infection.  · You may be given a medicine to numb each side of the cervix (intracervical or paracervical block). This is used to block and control any discomfort with insertion.  · A tool (  uterine sound) is inserted into the uterus to determine the length of the uterine cavity and the direction the uterus may be tilted.  · A slim instrument (IUD inserter) is inserted through the cervical canal and into your uterus.  · The IUD is placed in the uterine cavity and the insertion device is removed.  · The nylon string that is attached to the IUD and used for eventual IUD removal is trimmed. It is trimmed so that it lays high in the vagina, just outside the cervix.  AFTER THE PROCEDURE  · You may have bleeding after the procedure. This is normal. It varies from light spotting for a few days to menstrual-like  bleeding.   · You may have mild cramping.  Document Released: 07/01/2011 Document Revised: 08/23/2013 Document Reviewed: 04/23/2013  [employer]® Patient Information ©2014 [employer].

## 2014-02-26 ENCOUNTER — Encounter: Payer: Self-pay | Admitting: Gynecology

## 2014-03-28 ENCOUNTER — Ambulatory Visit (INDEPENDENT_AMBULATORY_CARE_PROVIDER_SITE_OTHER): Payer: BC Managed Care – PPO | Admitting: Gynecology

## 2014-03-28 ENCOUNTER — Encounter: Payer: Self-pay | Admitting: Gynecology

## 2014-03-28 DIAGNOSIS — Z30431 Encounter for routine checking of intrauterine contraceptive device: Secondary | ICD-10-CM

## 2014-03-28 NOTE — Progress Notes (Signed)
Dominique Murphy Mar 08, 1971 [id number]        43 y.o.  G1P1001 presents for IUD check. Had a Mirena IUD placed 02/20/2014. Has done well without complaints.  Past medical history,surgical history, problem list, medications, allergies, family history and social history were all reviewed and documented in the EPIC chart.  Directed ROS with pertinent positives and negatives documented in the history of present illness/assessment and plan.  Exam: Kim assistant General appearance  Normal External BUS vagina normal. Cervix normal. IUD string not visualized or palpated. Uterus normal size mobile nontender. Adnexa without masses or tenderness.  Colposcopy performed which showed her IUD string at the external os.  Assessment/Plan:  43 y.o. G1P1001 with normal IUD check. IUD string visualized at the external os with the colposcope. Patient will keep menstrual calendar and as long as doing well then will followup in March 2016 for her annual exam, sooner if any issues.   Note: This document was prepared with digital dictation and possible smart phrase technology. Any transcriptional errors that result from this process are unintentional.   Anastasio Auerbach MD, 3:56 PM 03/28/2014

## 2014-03-28 NOTE — Patient Instructions (Signed)
Followup for annual exam March 2016. Sooner if any issues.

## 2014-07-10 ENCOUNTER — Other Ambulatory Visit: Payer: Self-pay | Admitting: Women's Health

## 2014-07-11 NOTE — Telephone Encounter (Signed)
Called into pharmacy

## 2014-09-17 ENCOUNTER — Encounter: Payer: Self-pay | Admitting: Gynecology

## 2014-12-21 ENCOUNTER — Other Ambulatory Visit: Payer: Self-pay | Admitting: Otolaryngology

## 2014-12-21 DIAGNOSIS — H93293 Other abnormal auditory perceptions, bilateral: Secondary | ICD-10-CM

## 2014-12-26 ENCOUNTER — Ambulatory Visit
Admission: RE | Admit: 2014-12-26 | Discharge: 2014-12-26 | Disposition: A | Discharge status: Home / Self Care | Payer: BLUE CROSS/BLUE SHIELD | Source: Ambulatory Visit | Attending: Otolaryngology | Admitting: Otolaryngology

## 2014-12-26 DIAGNOSIS — H93293 Other abnormal auditory perceptions, bilateral: Secondary | ICD-10-CM

## 2014-12-26 MED ORDER — GADOBENATE DIMEGLUMINE 529 MG/ML IV SOLN
17.0000 mL | Freq: Once | INTRAVENOUS | Status: AC | PRN
  Administered 2014-12-26: 17 mL via INTRAVENOUS

## 2015-01-03 ENCOUNTER — Other Ambulatory Visit: Payer: Self-pay | Admitting: Otolaryngology

## 2015-01-03 DIAGNOSIS — H93293 Other abnormal auditory perceptions, bilateral: Secondary | ICD-10-CM

## 2015-01-04 ENCOUNTER — Ambulatory Visit
Admission: RE | Admit: 2015-01-04 | Discharge: 2015-01-04 | Disposition: A | Discharge status: Home / Self Care | Payer: BLUE CROSS/BLUE SHIELD | Source: Ambulatory Visit | Attending: Otolaryngology | Admitting: Otolaryngology

## 2015-01-04 DIAGNOSIS — H93293 Other abnormal auditory perceptions, bilateral: Secondary | ICD-10-CM

## 2015-01-04 MED ORDER — IOHEXOL 300 MG/ML  SOLN
75.0000 mL | Freq: Once | INTRAMUSCULAR | Status: AC | PRN
  Administered 2015-01-04: 75 mL via INTRAVENOUS

## 2015-01-21 ENCOUNTER — Other Ambulatory Visit: Payer: Self-pay | Admitting: Gynecology

## 2015-01-21 DIAGNOSIS — Z1231 Encounter for screening mammogram for malignant neoplasm of breast: Secondary | ICD-10-CM

## 2015-02-26 ENCOUNTER — Ambulatory Visit (HOSPITAL_COMMUNITY)
Admission: RE | Admit: 2015-02-26 | Discharge: 2015-02-26 | Disposition: A | Discharge status: Home / Self Care | Payer: BLUE CROSS/BLUE SHIELD | Source: Ambulatory Visit | Attending: Gynecology | Admitting: Gynecology

## 2015-02-26 DIAGNOSIS — Z1231 Encounter for screening mammogram for malignant neoplasm of breast: Secondary | ICD-10-CM | POA: Diagnosis present

## 2015-07-05 ENCOUNTER — Other Ambulatory Visit: Payer: Self-pay

## 2015-11-07 ENCOUNTER — Other Ambulatory Visit: Payer: Self-pay | Admitting: Otolaryngology

## 2015-11-07 DIAGNOSIS — K1123 Chronic sialoadenitis: Secondary | ICD-10-CM

## 2015-11-07 DIAGNOSIS — J329 Chronic sinusitis, unspecified: Secondary | ICD-10-CM

## 2015-11-17 HISTORY — PX: BREAST LUMPECTOMY: SHX2

## 2015-11-19 ENCOUNTER — Inpatient Hospital Stay: Admission: RE | Admit: 2015-11-19 | Payer: BLUE CROSS/BLUE SHIELD | Source: Ambulatory Visit

## 2015-11-19 ENCOUNTER — Other Ambulatory Visit: Payer: BLUE CROSS/BLUE SHIELD

## 2015-11-27 ENCOUNTER — Ambulatory Visit
Admission: RE | Admit: 2015-11-27 | Discharge: 2015-11-27 | Disposition: A | Discharge status: Home / Self Care | Payer: BLUE CROSS/BLUE SHIELD | Source: Ambulatory Visit | Attending: Otolaryngology | Admitting: Otolaryngology

## 2015-11-27 DIAGNOSIS — K1123 Chronic sialoadenitis: Secondary | ICD-10-CM

## 2015-11-27 MED ORDER — IOPAMIDOL (ISOVUE-300) INJECTION 61%
75.0000 mL | Freq: Once | INTRAVENOUS | Status: AC | PRN
  Administered 2015-11-27: 75 mL via INTRAVENOUS

## 2016-04-27 ENCOUNTER — Other Ambulatory Visit: Payer: Self-pay | Admitting: Gynecology

## 2016-04-27 DIAGNOSIS — Z1231 Encounter for screening mammogram for malignant neoplasm of breast: Secondary | ICD-10-CM

## 2016-04-28 ENCOUNTER — Ambulatory Visit
Admission: RE | Admit: 2016-04-28 | Discharge: 2016-04-28 | Disposition: A | Discharge status: Home / Self Care | Payer: BLUE CROSS/BLUE SHIELD | Source: Ambulatory Visit | Attending: Gynecology | Admitting: Gynecology

## 2016-04-28 DIAGNOSIS — Z1231 Encounter for screening mammogram for malignant neoplasm of breast: Secondary | ICD-10-CM

## 2016-04-30 ENCOUNTER — Other Ambulatory Visit: Payer: Self-pay | Admitting: Gynecology

## 2016-04-30 DIAGNOSIS — R928 Other abnormal and inconclusive findings on diagnostic imaging of breast: Secondary | ICD-10-CM

## 2016-05-05 ENCOUNTER — Other Ambulatory Visit: Payer: BLUE CROSS/BLUE SHIELD

## 2016-05-06 ENCOUNTER — Other Ambulatory Visit: Payer: Self-pay | Admitting: Gynecology

## 2016-05-06 ENCOUNTER — Ambulatory Visit
Admission: RE | Admit: 2016-05-06 | Discharge: 2016-05-06 | Disposition: A | Discharge status: Home / Self Care | Payer: BLUE CROSS/BLUE SHIELD | Source: Ambulatory Visit | Attending: Gynecology | Admitting: Gynecology

## 2016-05-06 DIAGNOSIS — N6310 Unspecified lump in the right breast, unspecified quadrant: Secondary | ICD-10-CM

## 2016-05-06 DIAGNOSIS — R928 Other abnormal and inconclusive findings on diagnostic imaging of breast: Secondary | ICD-10-CM

## 2016-05-06 DIAGNOSIS — R2231 Localized swelling, mass and lump, right upper limb: Secondary | ICD-10-CM

## 2016-05-11 ENCOUNTER — Encounter: Payer: Self-pay | Admitting: Hematology and Oncology

## 2016-05-11 ENCOUNTER — Ambulatory Visit
Admission: RE | Admit: 2016-05-11 | Discharge: 2016-05-11 | Disposition: A | Discharge status: Home / Self Care | Payer: BLUE CROSS/BLUE SHIELD | Source: Ambulatory Visit | Attending: Gynecology | Admitting: Gynecology

## 2016-05-11 ENCOUNTER — Other Ambulatory Visit: Payer: BLUE CROSS/BLUE SHIELD

## 2016-05-11 ENCOUNTER — Other Ambulatory Visit: Payer: Self-pay | Admitting: Gynecology

## 2016-05-11 DIAGNOSIS — N6310 Unspecified lump in the right breast, unspecified quadrant: Secondary | ICD-10-CM

## 2016-05-11 DIAGNOSIS — C50919 Malignant neoplasm of unspecified site of unspecified female breast: Secondary | ICD-10-CM

## 2016-05-11 DIAGNOSIS — R2231 Localized swelling, mass and lump, right upper limb: Secondary | ICD-10-CM

## 2016-05-11 HISTORY — DX: Malignant neoplasm of unspecified site of unspecified female breast: C50.919

## 2016-05-12 ENCOUNTER — Other Ambulatory Visit: Payer: BLUE CROSS/BLUE SHIELD

## 2016-05-14 ENCOUNTER — Telehealth: Payer: Self-pay | Admitting: Gynecology

## 2016-05-14 NOTE — Telephone Encounter (Signed)
Pt informed with the below note she is scheduled to see you on 05/26/16, and she declined paragard placement. Pt is seeing surgeons tomorrow at 8:00am

## 2016-05-14 NOTE — Telephone Encounter (Signed)
Left message for pt to call.

## 2016-05-14 NOTE — Telephone Encounter (Signed)
Tell patient I was sorry to hear about her recent diagnosis of breast cancer. I have not seen her in 2 years. We need to have a discussion about her Mirena IUD because it does have progesterone coating.  Recommend office visit to discuss this and possibly remove the IUD. We could replace it with a ParaGard copper-based IUD from a contraceptive standpoint if she desires.

## 2016-05-15 ENCOUNTER — Other Ambulatory Visit: Payer: Self-pay | Admitting: General Surgery

## 2016-05-15 ENCOUNTER — Encounter: Payer: Self-pay | Admitting: Hematology and Oncology

## 2016-05-15 ENCOUNTER — Encounter: Payer: Self-pay | Admitting: Radiation Oncology

## 2016-05-15 ENCOUNTER — Ambulatory Visit
Admission: RE | Admit: 2016-05-15 | Discharge: 2016-05-15 | Disposition: A | Discharge status: Home / Self Care | Payer: BLUE CROSS/BLUE SHIELD | Source: Ambulatory Visit | Attending: Radiation Oncology | Admitting: Radiation Oncology

## 2016-05-15 ENCOUNTER — Telehealth: Payer: Self-pay | Admitting: *Deleted

## 2016-05-15 ENCOUNTER — Ambulatory Visit (HOSPITAL_BASED_OUTPATIENT_CLINIC_OR_DEPARTMENT_OTHER): Payer: BLUE CROSS/BLUE SHIELD | Admitting: Hematology and Oncology

## 2016-05-15 ENCOUNTER — Encounter: Payer: Self-pay | Admitting: *Deleted

## 2016-05-15 VITALS — BP 121/81 | HR 74 | Temp 98.3°F | Resp 18 | Ht 66.0 in | Wt 171.3 lb

## 2016-05-15 DIAGNOSIS — C50411 Malignant neoplasm of upper-outer quadrant of right female breast: Secondary | ICD-10-CM

## 2016-05-15 DIAGNOSIS — C50911 Malignant neoplasm of unspecified site of right female breast: Secondary | ICD-10-CM

## 2016-05-15 DIAGNOSIS — Z170 Estrogen receptor positive status [ER+]: Secondary | ICD-10-CM | POA: Diagnosis not present

## 2016-05-15 DIAGNOSIS — Z510 Encounter for antineoplastic radiation therapy: Secondary | ICD-10-CM | POA: Diagnosis not present

## 2016-05-15 HISTORY — DX: Allergy, unspecified, initial encounter: T78.40XA

## 2016-05-15 HISTORY — DX: Malignant neoplasm of unspecified site of unspecified female breast: C50.919

## 2016-05-15 MED ORDER — TAMOXIFEN CITRATE 20 MG PO TABS: 20.0000 mg | ORAL_TABLET | Freq: Every day | ORAL | Status: DC

## 2016-05-15 NOTE — Progress Notes (Signed)
Radiation Oncology         [phone] ________________________________  Name: Dominique Murphy MRN: [id number]  Date: 05/15/2016  DOB: 1971/06/27  YT:KZSWFU,XNATFTD C, MD  Rolm Bookbinder, MD     REFERRING PHYSICIAN: Rolm Bookbinder, MD   DIAGNOSIS: The encounter diagnosis was Breast cancer of upper-outer quadrant of right female breast [facility]).  No matching staging information was found for the patient.   HISTORY OF PRESENT ILLNESS::Dominique Murphy is a 45 y.o. female who is seen for an initial consultation visit regarding the patient's diagnosis of breast cancer.  The patient was found to have suspicious findings within the right breast on initial mammogram. Density was categorized as C The patient has not had symptoms prior to this study. Breast ultrasound confirmed this finding. On ultrasound, the tumor measured 0.9 cm and was present in the upper outer quadrant, 5 cm from the nipple. Lateral to this mass was another smaller nodule measuring 0.5 cm. This was present 7 cm from the nipple also at the 9:30 o'clock position. Ultrasound also revealed a suspicious axillary lymph node with a thickened cortex  A biopsy was performed of both right-sided breast masses and a suspicious axillary lymph node. This revealed invasive ductal carcinoma, grade 2, with DCIS. This was present corresponding to the largest tumor within the breast. Receptors studies were completed and indicate that the tumor is estrogen receptor positive, progesterone receptor positive, and Her-2/neu negative. The Ki-67 staining was 10 %. The smaller right breast mass demonstrated fibrocystic changes with calcifications. This is felt to be discordant. Biopsy of the right axillary lymph node showed benign lymphoid tissue.     The patient has not undergone an MRI scan of the breasts: This currently has been scheduled.    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     PAST SURGICAL HISTORY: Past Surgical History  Procedure Laterality Date  . Acetabulum fracture surgery  2002  . Colposcopy    . Cesarean section  2000  . Wisdom tooth extraction    . Mirena      Inserted 02/20/2014     FAMILY HISTORY: family history includes Colon cancer (age of onset: 86) in her father; Heart disease in her maternal grandfather.   SOCIAL HISTORY:  reports that she has never smoked. She has never used smokeless tobacco. She reports that she drinks about 1.0 oz of alcohol per week. She reports that she does not use illicit drugs.   ALLERGIES: Morphine and related and Percocet   MEDICATIONS:  Current Outpatient Prescriptions  Medication Sig Dispense Refill  . levonorgestrel (MIRENA) 20 MCG/24HR IUD 1 each by Intrauterine route once.    . Multiple Vitamin (MULTIVITAMIN) tablet Take 1 tablet by mouth daily.    Marland Kitchen zolpidem (AMBIEN) 10 MG tablet TAKE 1 TABLET AT BEDTIME AS NEEDED FOR SLEEP 30 tablet 1  . tamoxifen (NOLVADEX) 20 MG tablet Take 1 tablet (20 mg total) by mouth daily. (Patient not taking: Reported on 05/15/2016) 30 tablet 0   Current Facility-Administered Medications  Medication Dose Route Frequency Provider Last Rate Last Dose  . levonorgestrel (MIRENA) 20 MCG/24HR IUD   Intrauterine Once Anastasio Auerbach, MD         REVIEW OF SYSTEMS:  A 15 point review of systems is documented in the electronic medical record. This was obtained by the nursing staff. However, I  reviewed this with the patient to discuss relevant findings and make appropriate changes.  Pertinent items are noted in HPI.    PHYSICAL EXAM:  vitals were not taken for this visit.  ECOG = 0  0 - Asymptomatic (Fully active, able to carry on all predisease activities without restriction)  1 - Symptomatic but completely ambulatory (Restricted in physically strenuous activity but ambulatory and able to  carry out work of a light or sedentary nature. For example, light housework, office work)  2 - Symptomatic, <50% in bed during the day (Ambulatory and capable of all self care but unable to carry out any work activities. Up and about more than 50% of waking hours)  3 - Symptomatic, >50% in bed, but not bedbound (Capable of only limited self-care, confined to bed or chair 50% or more of waking hours)  4 - Bedbound (Completely disabled. Cannot carry on any self-care. Totally confined to bed or chair)  5 - Death   Eustace Pen MM, Creech RH, Tormey DC, et al. [phone]). "Toxicity and response criteria of the [facility]". [place] Oncol. 5 (6): 649-55  General: Well-developed, in no acute distress HEENT: Normocephalic, atraumatic; oral cavity clear Neck: Supple without any lymphadenopathy Cardiovascular: Regular rate and rhythm Respiratory: Clear to auscultation bilaterally Breasts: No palpable breast masses present other than some apparent post biopsy change in the upper outer quadrant of the right breast. No axillary lymphadenopathy present. Benign breast exam on the left and no axillary adenopathy on this side  GI: Soft, nontender, normal bowel sounds Extremities: No edema present Neuro: No focal deficits     LABORATORY DATA:  Lab Results  Component Value Date   WBC 6.3 01/26/2013   HGB 12.6 01/26/2013   HCT 37.6 01/26/2013   MCV 90.8 01/26/2013   PLT 301 01/26/2013   Lab Results  Component Value Date   NA 138 01/31/2014   K 4.4 01/31/2014   CL 103 01/31/2014   CO2 27 01/31/2014   Lab Results  Component Value Date   ALT 19 01/31/2014   AST 26 01/31/2014   ALKPHOS 66 01/31/2014   BILITOT 0.3 01/31/2014      RADIOGRAPHY: Mm Digital Diagnostic Unilat R  05/11/2016  CLINICAL DATA:  Two right breast biopsies were performed today in the 9:30 position of the right breast. A right axillary lymph node was also biopsied. EXAM: DIAGNOSTIC RIGHT MAMMOGRAM POST  ULTRASOUND BIOPSY COMPARISON:  Previous exam(s). FINDINGS: Mammographic images were obtained following ultrasound guided biopsies of a 9 mm mass at 9:30 position right breast 5 cm from the nipple, a 5 mm mass at 9:30 position 7 cm from the nipple, and a right axillary lymph node with subtle cortical thickening. A ribbon shaped biopsy clip is satisfactorily positioned in the 9:30 position right breast mass 7 cm from the nipple. This mass is best visualized on ultrasound. A coil shaped biopsy clip is satisfactorily positioned within the larger mass measuring 9 mm at 9:30 position approximately 5 cm from the nipple. The right axillary HydroMARK clip is not visible on this post clip mammogram. IMPRESSION: Satisfactory position of 2 biopsy clips in the 9:30 position of the right breast. Final Assessment: Post Procedure Mammograms for Marker Placement Electronically Signed   By: Curlene Dolphin M.D.   On: 05/11/2016 10:45   US Breast Ltd Uni Right Inc Axilla  05/06/2016  CLINICAL DATA:  The patient returns after screening study for evaluation of a possible right breast mass. EXAM: 2D  DIGITAL DIAGNOSTIC RIGHT MAMMOGRAM WITH ADJUNCT TOMO ULTRASOUND RIGHT BREAST COMPARISON:  04/28/2016 and earlier ACR Breast Density Category b: There are scattered areas of fibroglandular density. FINDINGS: Additional views are performed, demonstrating a spiculated mass in the upper-outer quadrant of the right breast. Adjacent possible area of distortion is also noted. On physical exam, I palpate no abnormality in the upper-outer quadrant of the right breast. Targeted ultrasound is performed, showing a hypoechoic irregular mass which is taller than wide in the 930 o'clock location of the right breast 5 cm from the nipple. Mass measures 0.7 x 0.8 x 0.9 cm. There is associated and adjacent vascularity on Doppler evaluation. 2.4 cm lateral to this mass also in the 930 o'clock location, there is a vague area of acoustic shadowing associated with  a subtle hypoechoic nodule. Nodule measures approximately 0.5 x 0.4 cm. Evaluation of the axilla demonstrates a single axillary lymph node with thickened cortex. Other axillary lymph nodes have normal morphology. IMPRESSION: 1. Irregular mass in the 930 o'clock location of the right breast 5 cm from the nipple suspicious for malignancy. 2. Adjacent subtle possible satellite lesion in the 930 o'clock location 7 cm from the nipple. 3. Single lower axillary lymph node with abnormal morphology. RECOMMENDATION: Ultrasound-guided core biopsies of the 2 right breast masses and right axillary lymph node are recommended and will be scheduled for the patient. I have discussed the findings and recommendations with the patient. Results were also provided in writing at the conclusion of the visit. If applicable, a reminder letter will be sent to the patient regarding the next appointment. BI-RADS CATEGORY  4: Suspicious. Electronically Signed   By: Nolon Nations M.D.   On: 05/06/2016 15:33   Mm Diag Breast Tomo Uni Right  05/06/2016  CLINICAL DATA:  The patient returns after screening study for evaluation of a possible right breast mass. EXAM: 2D DIGITAL DIAGNOSTIC RIGHT MAMMOGRAM WITH ADJUNCT TOMO ULTRASOUND RIGHT BREAST COMPARISON:  04/28/2016 and earlier ACR Breast Density Category b: There are scattered areas of fibroglandular density. FINDINGS: Additional views are performed, demonstrating a spiculated mass in the upper-outer quadrant of the right breast. Adjacent possible area of distortion is also noted. On physical exam, I palpate no abnormality in the upper-outer quadrant of the right breast. Targeted ultrasound is performed, showing a hypoechoic irregular mass which is taller than wide in the 930 o'clock location of the right breast 5 cm from the nipple. Mass measures 0.7 x 0.8 x 0.9 cm. There is associated and adjacent vascularity on Doppler evaluation. 2.4 cm lateral to this mass also in the 930 o'clock  location, there is a vague area of acoustic shadowing associated with a subtle hypoechoic nodule. Nodule measures approximately 0.5 x 0.4 cm. Evaluation of the axilla demonstrates a single axillary lymph node with thickened cortex. Other axillary lymph nodes have normal morphology. IMPRESSION: 1. Irregular mass in the 930 o'clock location of the right breast 5 cm from the nipple suspicious for malignancy. 2. Adjacent subtle possible satellite lesion in the 930 o'clock location 7 cm from the nipple. 3. Single lower axillary lymph node with abnormal morphology. RECOMMENDATION: Ultrasound-guided core biopsies of the 2 right breast masses and right axillary lymph node are recommended and will be scheduled for the patient. I have discussed the findings and recommendations with the patient. Results were also provided in writing at the conclusion of the visit. If applicable, a reminder letter will be sent to the patient regarding the next appointment. BI-RADS CATEGORY  4: Suspicious.  Electronically Signed   By: Nolon Nations M.D.   On: 05/06/2016 15:33   Mm Screening Breast Tomo Bilateral  04/29/2016  CLINICAL DATA:  Screening. EXAM: 2D DIGITAL SCREENING BILATERAL MAMMOGRAM WITH CAD AND ADJUNCT TOMO COMPARISON:  Previous exam(s). ACR Breast Density Category c: The breast tissue is heterogeneously dense, which may obscure small masses. FINDINGS: In the right breast, a possible mass warrants further evaluation. In the left breast, no findings suspicious for malignancy. Images were processed with CAD. IMPRESSION: Further evaluation is suggested for possible mass in the right breast. RECOMMENDATION: Diagnostic mammogram and possibly ultrasound of the right breast. (Code:[id number]) The patient will be contacted regarding the findings, and additional imaging will be scheduled. BI-RADS CATEGORY  0: Incomplete. Need additional imaging evaluation and/or prior mammograms for comparison. Electronically Signed   By: Claudie Revering  M.D.   On: 04/29/2016 07:49   [place] Rt Breast Bx W Loc Dev 1st Lesion Img Bx Spec US Guide  05/14/2016  ADDENDUM REPORT: 05/14/2016 07:53 ADDENDUM: Pathology revealed FIBROCYSTIC CHANGES WITH CALCIFICATIONS of the Right breast at the 9:30 o'clock location, 7 CMFN, with excision recommended as this is thought to be a possible satellite nodule. (Ribbon Clip) This was found to be discordant by Dr. Curlene Dolphin. 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 BENIGN LYMPHOID TISSUE of the Right axilla. [facility] clip). This was found to be concordant by Dr. Curlene Dolphin. Pathology results were discussed with the patient by telephone. The patient reported doing well after the biopsies with tenderness at the sites. Post biopsy instructions and care were reviewed and questions were answered. The patient was encouraged to call The [place] for any additional concerns. Surgical consultation has been arranged with Dr. Rolm Bookbinder at [facility] on May 15, 2016. Pathology results reported by Terie Purser, RN on 05/13/2016. Electronically Signed   By: Curlene Dolphin M.D.   On: 05/14/2016 07:53  05/14/2016  CLINICAL DATA:  45 year old patient presents for 2 right breast biopsies and 1 right axillary lymph node biopsy today. This report describes biopsy performed at 9:30 position 7 cm from the nipple. EXAM: ULTRASOUND GUIDED RIGHT BREAST CORE NEEDLE BIOPSY COMPARISON:  Previous exam(s). FINDINGS: I met with the patient and we discussed the procedure of ultrasound-guided biopsy, including benefits and alternatives. We discussed the high likelihood of a successful procedure. We discussed the risks of the procedure, including infection, bleeding, tissue injury, clip migration, and inadequate sampling. Informed written consent was given. The usual time-out protocol was performed  immediately prior to the procedure. Using sterile technique and 1% Lidocaine as local anesthetic, under direct ultrasound visualization, a 12 gauge spring-loaded device was used to perform biopsy of a subtle hypoechoic mass with shadowing, measuring approximately 5 mm at 9:30 position 7 cm from the nipple using a lateral to medial approach. At the conclusion of the procedure a ribbon tissue marker clip was deployed into the biopsy cavity. Follow up 2 view mammogram was performed and dictated separately. IMPRESSION: Ultrasound guided biopsy of the right breast. No apparent complications. Electronically Signed: By: Curlene Dolphin M.D. On: 05/11/2016 10:40   [place] Rt Breast Bx W Loc Dev Ea Add Lesion Img Bx Spec US Guide  05/14/2016  ADDENDUM REPORT: 05/14/2016 07:52 ADDENDUM: Pathology revealed FIBROCYSTIC CHANGES WITH CALCIFICATIONS of the Right breast at the 9:30 o'clock location, 7 CMFN, with excision recommended as this  is thought to be a possible satellite nodule. (Ribbon Clip) This was found to be discordant by Dr. Curlene Dolphin. 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 BENIGN LYMPHOID TISSUE of the Right axilla. [facility] clip). This was found to be concordant by Dr. Curlene Dolphin. Pathology results were discussed with the patient by telephone. The patient reported doing well after the biopsies with tenderness at the sites. Post biopsy instructions and care were reviewed and questions were answered. The patient was encouraged to call The [place] for any additional concerns. Surgical consultation has been arranged with Dr. Rolm Bookbinder at [facility] on May 15, 2016. Pathology results reported by Terie Purser, RN on 05/13/2016. Electronically Signed   By: Curlene Dolphin M.D.   On: 05/14/2016 07:52  05/14/2016  CLINICAL DATA:  45 year old patient presents for 2  right breast biopsies and 1 right axillary lymph node biopsy today. This report describes a biopsy of a 9 mm mass at 9:30 position right breast 5 cm from the nipple. EXAM: ULTRASOUND GUIDED RIGHT BREAST CORE NEEDLE BIOPSY COMPARISON:  Previous exam(s). FINDINGS: I met with the patient and we discussed the procedure of ultrasound-guided biopsy, including benefits and alternatives. We discussed the high likelihood of a successful procedure. We discussed the risks of the procedure, including infection, bleeding, tissue injury, clip migration, and inadequate sampling. Informed written consent was given. The usual time-out protocol was performed immediately prior to the procedure. Using sterile technique and 1% Lidocaine as local anesthetic, under direct ultrasound visualization, a 12 gauge spring-loaded device was used to perform biopsy of a 9 mm mass at 9:30 position 5 cm the nipple using a lateral to medial approach. At the conclusion of the procedure a coil tissue marker clip was deployed into the biopsy cavity. Follow up 2 view mammogram was performed and dictated separately. IMPRESSION: Ultrasound guided biopsy of the right breast. No apparent complications. Electronically Signed: By: Curlene Dolphin M.D. On: 05/11/2016 10:41   [place] Rt Breast Bx W Loc Dev Ea Add Lesion Img Bx Spec US Guide  05/14/2016  ADDENDUM REPORT: 05/14/2016 07:54 ADDENDUM: Pathology revealed FIBROCYSTIC CHANGES WITH CALCIFICATIONS of the Right breast at the 9:30 o'clock location, 7 CMFN, with excision recommended as this is thought to be a possible satellite nodule. (Ribbon Clip) This was found to be discordant by Dr. Curlene Dolphin. 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 BENIGN LYMPHOID TISSUE of the Right axilla. [facility] clip). This was found to be concordant by Dr. Curlene Dolphin. Pathology results were discussed with the patient  by telephone. The patient reported doing well after the biopsies with tenderness at the sites. Post biopsy instructions and care were reviewed and questions were answered. The patient was encouraged to call The [place] for any additional concerns. Surgical consultation has been arranged with Dr. Rolm Bookbinder at [facility] on May 15, 2016. Pathology results reported by Terie Purser, RN on 05/13/2016. Electronically Signed   By: Curlene Dolphin M.D.   On: 05/14/2016 07:54  05/14/2016  CLINICAL DATA:  45 year old patient presents for 2 right breast biopsies and 1 right axillary lymph node biopsy today. This report describes a right axillary lymph node biopsy. EXAM: ULTRASOUND GUIDED RIGHT BREAST CORE NEEDLE BIOPSY COMPARISON:  Previous  exam(s). FINDINGS: I met with the patient and we discussed the procedure of ultrasound-guided biopsy, including benefits and alternatives. We discussed the high likelihood of a successful procedure. We discussed the risks of the procedure, including infection, bleeding, tissue injury, clip migration, and inadequate sampling. Informed written consent was given. The usual time-out protocol was performed immediately prior to the procedure. Using sterile technique and 1% Lidocaine as local anesthetic, under direct ultrasound visualization, a 14 gauge spring-loaded device was used to perform biopsy of a right axillary lymph node using a lateral to medial approach. At the conclusion of the procedure a HydroMARK tissue marker clip was deployed into the biopsy cavity. IMPRESSION: Ultrasound guided biopsy of right axillary lymph node. No apparent complications. Electronically Signed: By: Curlene Dolphin M.D. On: 05/11/2016 10:42       IMPRESSION:    Breast cancer of upper-outer quadrant of right female breast ([place])   05/11/2016 Initial Diagnosis Right breast biopsy 9:30 position 7cmfn: Fibrocystic change (5 mm); 5 cmfn: IDC grade 2, DCIS, LN  axilla: Biopsy neg; ER 95%, PR 95%, HER-2 negative ratio 1.38, Ki-67 10%; right breast mass 9 mm, T1b N0 stage IA    The patient has a recent diagnosis of Invasive ductal carcinomaof the rightst.  clinically this represents a T1b N0 M0 tumor. She appears to be a good candidate for breast conservation treatment. She has discussed surgery with Dr. Donne Hazel and this has been scheduled for August.   The patient saw medical oncology earlier today and due to the timing of her workup, she is to begin tamoxifen. Dr.  Lindi Adie has counseled her that he feels her IUD can remain in place. She does have a breast MRI scan pending and also is being referred to genetics.   I discussed with the patient the role of adjuvant radiation treatment in this setting. We discussed the potential benefit of radiation treatment, especially with regards to local control of the patient's tumor. We also discussed the possible side effects and risks of such a treatment as well.  All of the patient's questions were answered. The patient wishes to proceed with radiation treatment at the appropriate time.  PLAN: I look forward to seeing the patient postoperatively to review her case and further discuss and coordinate an anticipated course of radiation treatment. She is aware of the timing of possible radiation treatment and also the plan to undergo an Oncotype test.       ________________________________   Jodelle Gross, MD, PhD   **Disclaimer: This note was dictated with voice recognition software. Similar sounding words can inadvertently be transcribed and this note may contain transcription errors which may not have been corrected upon publication of note.**

## 2016-05-15 NOTE — Telephone Encounter (Signed)
error 

## 2016-05-15 NOTE — Progress Notes (Signed)
Please see the Nurse Progress Note in the MD Initial Consult Encounter for this patient. 

## 2016-05-15 NOTE — Progress Notes (Signed)
[place] CONSULT NOTE  Patient Care Team: Chesley Noon, MD as PCP - General (Family Medicine)  CHIEF COMPLAINTS/PURPOSE OF CONSULTATION:  Newly diagnosed breast cancer  HISTORY OF PRESENTING ILLNESS:  Dominique Murphy 45 y.o. female is here because of recent diagnosis of right breast cancer. Patient had a routine screening mammogram the detected 2 areas of abnormalities in the right breast at 9:30 position. 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 She also had an enlarged lymph node which on biopsy was negative. The invasive ductal carcinoma was ER/PR positive HER-2 negative with a Ki-67 10%. The second mass which was also 9:30 position but 7 cm from the nipple came back as fibrocystic change. There is a suspicion that it may be discordant. Patient saw Dr. Donne Hazel who is setting her up for genetics appointment as well as breast MRI. Because of variety of reasons including patient's medication, her surgery is being postponed to August.  I reviewed her records extensively and collaborated the history with the patient.  SUMMARY OF ONCOLOGIC HISTORY:   Breast cancer of upper-outer quadrant of right female breast ([place])   05/11/2016 Initial Diagnosis Right breast biopsy 9:30 position 7cmfn: Fibrocystic change (5 mm); 5 cmfn: IDC grade 2, DCIS, LN axilla: Biopsy neg; ER 95%, PR 95%, HER-2 negative ratio 1.38, Ki-67 10%; right breast mass 9 mm, T1b N0 stage IA    MEDICAL HISTORY:  Past Medical History  Diagnosis Date  . CIN I (cervical intraepithelial neoplasia I) 02/2013    LGSIL on colposcopic biopsy  . Breast cancer ([place]) 05/11/16    Right Breast     SURGICAL HISTORY: Past Surgical History  Procedure Laterality Date  . Acetabulum fracture surgery  2002  . Colposcopy    . Cesarean section  2000  . Wisdom tooth extraction    . Mirena      Inserted 02/20/2014    SOCIAL HISTORY: Social  History   Social History  . Marital Status: Married    Spouse Name: N/A  . Number of Children: 1  . Years of Education: N/A   Occupational History  . Realtor    Social History Main Topics  . Smoking status: Never Smoker   . Smokeless tobacco: Never Used  . Alcohol Use: 1.0 oz/week    2 drink(s) per week  . Drug Use: No  . Sexual Activity: Yes    Birth Control/ Protection: IUD     Comment: Mirena inserted 02/20/2014   Other Topics Concern  . Not on file   Social History Narrative    FAMILY HISTORY: Family History  Problem Relation Age of Onset  . Colon cancer Father 48  . Heart disease Maternal Grandfather     ALLERGIES:  is allergic to morphine and related and percocet.  MEDICATIONS:  Current Outpatient Prescriptions  Medication Sig Dispense Refill  . levonorgestrel (MIRENA) 20 MCG/24HR IUD 1 each by Intrauterine route once.    . Multiple Vitamin (MULTIVITAMIN) tablet Take 1 tablet by mouth daily.    . tamoxifen (NOLVADEX) 20 MG tablet Take 1 tablet (20 mg total) by mouth daily. 30 tablet 0  . zolpidem (AMBIEN) 10 MG tablet TAKE 1 TABLET AT BEDTIME AS NEEDED FOR SLEEP 30 tablet 1   Current Facility-Administered Medications  Medication Dose Route Frequency Provider Last Rate Last Dose  . levonorgestrel (MIRENA) 20 MCG/24HR IUD   Intrauterine Once [employer]  Corey Skains, MD        REVIEW OF SYSTEMS:   Constitutional: Denies fevers, chills or abnormal night sweats Eyes: Denies blurriness of vision, double vision or watery eyes Ears, nose, mouth, throat, and face: Denies mucositis or sore throat Respiratory: Denies cough, dyspnea or wheezes Cardiovascular: Denies palpitation, chest discomfort or lower extremity swelling Gastrointestinal:  Denies nausea, heartburn or change in bowel habits Skin: Denies abnormal skin rashes Lymphatics: Denies new lymphadenopathy or easy bruising Neurological:Denies numbness, tingling or new weaknesses Behavioral/Psych: Mood is stable,  no new changes  Breast:  Denies any palpable lumps or discharge All other systems were reviewed with the patient and are negative.  PHYSICAL EXAMINATION: ECOG PERFORMANCE STATUS: 0 - Asymptomatic  Filed Vitals:   05/15/16 1200  BP: 121/81  Pulse: 74  Temp: 98.3 F (36.8 C)  Resp: 18   Filed Weights   05/15/16 1200  Weight: 171 lb 4.8 oz (77.701 kg)    GENERAL:alert, no distress and comfortable SKIN: skin color, texture, turgor are normal, no rashes or significant lesions EYES: normal, conjunctiva are pink and non-injected, sclera clear OROPHARYNX:no exudate, no erythema and lips, buccal mucosa, and tongue normal  NECK: supple, thyroid normal size, non-tender, without nodularity LYMPH:  no palpable lymphadenopathy in the cervical, axillary or inguinal LUNGS: clear to auscultation and percussion with normal breathing effort HEART: regular rate & rhythm and no murmurs and no lower extremity edema ABDOMEN:abdomen soft, non-tender and normal bowel sounds Musculoskeletal:no cyanosis of digits and no clubbing  PSYCH: alert & oriented x 3 with fluent speech NEURO: no focal motor/sensory deficits BREAST: No palpable nodules in breast. No palpable axillary or supraclavicular lymphadenopathy (exam performed in the presence of a chaperone)   LABORATORY DATA:  I have reviewed the data as listed Lab Results  Component Value Date   WBC 6.3 01/26/2013   HGB 12.6 01/26/2013   HCT 37.6 01/26/2013   MCV 90.8 01/26/2013   PLT 301 01/26/2013   Lab Results  Component Value Date   NA 138 01/31/2014   K 4.4 01/31/2014   CL 103 01/31/2014   CO2 27 01/31/2014    RADIOGRAPHIC STUDIES: I have personally reviewed the radiological reports and agreed with the findings in the report.  ASSESSMENT AND PLAN:  Breast cancer of upper-outer quadrant of right female breast ([place]) Right breast biopsy 9:30 position 7cmfn 05/11/2016: Fibrocystic change (5 mm); 5 cmfn: IDC grade 2, DCIS, LN axilla:  Biopsy neg; ER 95%, PR 95%, HER-2 negative ratio 1.38, Ki-67 10%; right breast mass 9 mm, T1b N0 stage IA  Pathology and radiology counseling:Discussed with the patient, the details of pathology including the type of breast cancer,the clinical staging, the significance of ER, PR and HER-2/neu receptors and the implications for treatment. After reviewing the pathology in detail, we proceeded to discuss the different treatment options between surgery, radiation, chemotherapy, antiestrogen therapies.  Recommendations: 1. Breast conserving surgery followed by 2. Oncotype DX testing to determine if chemotherapy would be of any benefit followed by 3. Adjuvant radiation therapy followed by 4. Adjuvant antiestrogen therapy  Because the surgery is not happening until August, I recommended neoadjuvant tamoxifen 20 mg daily for this month. I counseled her about the risks and benefits of tamoxifen and the risk of hot flashes, myalgias, risk of DVT, uterine hypertrophy and uterine cancer as well as mood swings and vaginal dryness. Patient plans to start tamoxifen on July 4. I sent a 30 day prescription for tamoxifen.  Oncotype counseling: I discussed  Oncotype DX test. I explained to the patient that this is a 21 gene panel to evaluate patient tumors DNA to calculate recurrence score. This would help determine whether patient has high risk or intermediate risk or low risk breast cancer. She understands that if her tumor was found to be high risk, she would benefit from systemic chemotherapy. If low risk, no need of chemotherapy. If she was found to be intermediate risk, we would need to evaluate the score as well as other risk factors and determine if an abbreviated chemotherapy may be of benefit.  Return to clinic after surgery to discuss final pathology report and then determine if Oncotype DX testing will need to be sent.  All questions were answered. The patient knows to call the clinic with any problems,  questions or concerns.    Rulon Eisenmenger, MD 05/15/2016

## 2016-05-15 NOTE — Progress Notes (Signed)
Location of Breast Cancer:Right Breast 9:30 O'clock - Invasive ductal Carcinoma with Calcifications  Histology per Pathology  05/11/16 Diagnosis 1. Breast, right, needle core biopsy, 9:30 o'clock 7 CMFN FIBROCYSTIC CHANGES WITH CALCIFICATIONS 2. Breast, right, needle core biopsy, 9:30 5 CMFN INVASIVE DUCTAL CARCINOMA WITH CALCIFICATIONS, GRADE 2 DUCTAL CARCINOMA IN SITU 3. Lymph node, needle/core biopsy, right axilla BENIGN LYMPHOID TISSUE  Receptor Status: ER(95%), PR (95%), Her2-neu (Neg), Ki- 67(10%)  Did patient present with symptoms (if so, please note symptoms) or was this found on screening mammography?: Mammmogram: Irregular mass in the 930 o'clock location of the right breast 5cm from the nipple suspicious for malignancy.  Adjacent subtle possible satellite lesion in the 930 o'clocklocation 7 cm from the nipple. Single lower axillary lymph node with abnormal morphology.  Past/Anticipated interventions by surgeon, if any: Seen by Dr. Lysbeth Penner  surgey not  scheduled  Sometime in August   Past/Anticipated interventions by medical oncology, if any: Chemotherapy Seen by Dr. Nicholas Lose today prior to Radiation Oncology [place] Visit, will start Tamoxifen 05/20/16  Lymphedema issues, if any:  No  Pain issues, if any:   Soreness from bx right breast,    SAFETY ISSUES: NO  Prior radiation? No  Pacemaker/ICD No  Possible current pregnancy? NO  Is the patient on methotrexate? No  Current Complaints / other details: insomnia, anxiety   Mirena -  Still in    Age1st Menses 11,    BC no ,  Age 1st live birth 59,  G44, P1,   HRT no   Father Colon Cancer age 33,  Surgery and chemotherapy, living, parental grandfather great Uncle colon cancer,  Vitals taken in [place] 98.3, 121/81, 74-218 weight 171.4lb   Deirdre Evener, RN 05/15/2016,11:02 AM

## 2016-05-15 NOTE — Assessment & Plan Note (Signed)
Right breast biopsy 9:30 position 7cmfn 05/11/2016: Fibrocystic change (5 mm); 5 cmfn: IDC grade 2, DCIS, LN axilla: Biopsy neg; ER 95%, PR 95%, HER-2 negative ratio 1.38, Ki-67 10%; right breast mass 9 mm, T1b N0 stage IA  Pathology and radiology counseling:Discussed with the patient, the details of pathology including the type of breast cancer,the clinical staging, the significance of ER, PR and HER-2/neu receptors and the implications for treatment. After reviewing the pathology in detail, we proceeded to discuss the different treatment options between surgery, radiation, chemotherapy, antiestrogen therapies.  Recommendations: 1. Breast conserving surgery followed by 2. Oncotype DX testing to determine if chemotherapy would be of any benefit followed by 3. Adjuvant radiation therapy followed by 4. Adjuvant antiestrogen therapy  Oncotype counseling: I discussed Oncotype DX test. I explained to the patient that this is a 21 gene panel to evaluate patient tumors DNA to calculate recurrence score. This would help determine whether patient has high risk or intermediate risk or low risk breast cancer. She understands that if her tumor was found to be high risk, she would benefit from systemic chemotherapy. If low risk, no need of chemotherapy. If she was found to be intermediate risk, we would need to evaluate the score as well as other risk factors and determine if an abbreviated chemotherapy may be of benefit.  Return to clinic after surgery to discuss final pathology report and then determine if Oncotype DX testing will need to be sent.

## 2016-05-20 ENCOUNTER — Other Ambulatory Visit: Payer: Self-pay | Admitting: General Surgery

## 2016-05-20 ENCOUNTER — Ambulatory Visit
Admission: RE | Admit: 2016-05-20 | Discharge: 2016-05-20 | Disposition: A | Discharge status: Home / Self Care | Payer: BLUE CROSS/BLUE SHIELD | Source: Ambulatory Visit | Attending: General Surgery | Admitting: General Surgery

## 2016-05-20 ENCOUNTER — Encounter: Payer: Self-pay | Admitting: Genetic Counselor

## 2016-05-20 ENCOUNTER — Other Ambulatory Visit: Payer: BLUE CROSS/BLUE SHIELD

## 2016-05-20 ENCOUNTER — Ambulatory Visit (HOSPITAL_BASED_OUTPATIENT_CLINIC_OR_DEPARTMENT_OTHER): Payer: BLUE CROSS/BLUE SHIELD | Admitting: Genetic Counselor

## 2016-05-20 ENCOUNTER — Telehealth: Payer: Self-pay | Admitting: *Deleted

## 2016-05-20 DIAGNOSIS — C50911 Malignant neoplasm of unspecified site of right female breast: Secondary | ICD-10-CM

## 2016-05-20 DIAGNOSIS — C50411 Malignant neoplasm of upper-outer quadrant of right female breast: Secondary | ICD-10-CM | POA: Diagnosis not present

## 2016-05-20 DIAGNOSIS — Z800 Family history of malignant neoplasm of digestive organs: Secondary | ICD-10-CM | POA: Diagnosis not present

## 2016-05-20 NOTE — Progress Notes (Signed)
REFERRING PROVIDER: Chesley Noon, MD [place], [place] [address]   Nicholas Lose, MD  Rolm Bookbinder, MD  PRIMARY PROVIDER:  Chesley Noon, MD  PRIMARY REASON FOR VISIT:  1. Breast cancer of upper-outer quadrant of right female breast ([place])   2. Family history of colon cancer      HISTORY OF PRESENT ILLNESS:   Dominique Murphy, a 45 y.o. female, was seen for a [facility] cancer genetics consultation at the request of Dr. Donne Hazel due to a personal history breast cancer and family history of colon cancer.  Dominique Murphy presents to clinic today to discuss the possibility of a hereditary predisposition to cancer, genetic testing, and to further clarify her future cancer risks, as well as potential cancer risks for family members.   In 2017, at the age of 48, Dominique Murphy was diagnosed with DCIS of the right breast. This may treated with either lumpectomy with radiation or mastectomy.     CANCER HISTORY:    Breast cancer of upper-outer quadrant of right female breast ([place])   05/11/2016 Initial Diagnosis Right breast biopsy 9:30 position 7cmfn: Fibrocystic change (5 mm); 5 cmfn: IDC grade 2, DCIS, LN axilla: Biopsy neg; ER 95%, PR 95%, HER-2 negative ratio 1.38, Ki-67 10%; right breast mass 9 mm, T1b N0 stage IA     HORMONAL RISK FACTORS:  Menarche was at age 48.  First live birth at age 70.  OCP use for approximately 15 years.  Ovaries intact: yes.  Hysterectomy: no.  Menopausal status: premenopausal.  HRT use: 0 years. Colonoscopy: yes; normal. Mammogram within the last year: yes. Number of breast biopsies: 1. Up to date with pelvic exams:  yes. Any excessive radiation exposure in the past:  no  Past Medical History  Diagnosis Date  . CIN I (cervical intraepithelial neoplasia I) 02/2013    LGSIL on colposcopic biopsy  . Allergy   . Breast cancer ([place]) 05/11/16    Right Breast   . Family history of colon cancer     Past Surgical History  Procedure  Laterality Date  . Acetabulum fracture surgery  2002  . Colposcopy    . Cesarean section  2000  . Wisdom tooth extraction    . Mirena      Inserted 02/20/2014    Social History   Social History  . Marital Status: Married    Spouse Name: Marya Amsler  . Number of Children: 1  . Years of Education: N/A   Occupational History  . Realtor    Social History Main Topics  . Smoking status: Never Smoker   . Smokeless tobacco: Never Used  . Alcohol Use: 1.0 oz/week    2 Standard drinks or equivalent per week  . Drug Use: No  . Sexual Activity: Yes    Birth Control/ Protection: IUD     Comment: Mirena inserted 02/20/2014   Other Topics Concern  . None   Social History Narrative     FAMILY HISTORY:  We obtained a detailed, 4-generation family history.  Significant diagnoses are listed below: Family History  Problem Relation Age of Onset  . Colon cancer Father 59  . Heart disease Maternal Grandfather   . Skin cancer Sister 1  . Colon cancer Paternal Grandfather 38    PGF Brother   Patient has a 59 year old son who has never had any childhood cancer. Patient has a sister who is 46 years of age and has had skin cancer on her face  at age 77. The sister has a 60 year old son who has not had any childhood cancer. Patient's mother is 29 years old with no history of cancer. Patient's mother is an only child. Maternal grandmother passed away at 52 and maternal grandfather passed away at 68. No cancer reported on maternal side of the family. Patient's father was diagnosed with colon cancer at 46 years of age and is currently living at 46 years of age. Patient's father has 11 siblings in 50-70s range and all are living cancer free except once sister who passed at 44 from drowning. None of the 73 paternal cousins are reported to have cancer and all are younger than the patient. Paternal grandmother is 19 years old. Paternal grandfather passed away at 76. Paternal grandfather's brother, Maurie Boettcher, had colon  cancer at 46 years of age. No other cancer is reported on paternal side of the family.   Patient's maternal ancestors are of [place] descent, and paternal ancestors are of [place] descent. There no reported Ashkenazi Jewish ancestry. There no known consanguinity.  GENETIC COUNSELING ASSESSMENT: Dominique Murphy is a 45 y.o. female with a personal history of DCIS breast cancer in her right breast and family history of colon cancer which is somewhat suggestive of a hereditary cancer syndrome and predisposition to cancer. We, therefore, discussed and recommended the following at today's visit.   DISCUSSION: We discussed that about 5-10% of breast cancer is hereditary with most cases due to BRCA mutations.  Based on the cancer in her family we would be most concerned about genes associated with either breast or colon cancer.  Based on Dominique Murphy's personal and family history of cancer, she does not meet medical criteria for genetic testing.  She misses the medical criteria of 45 years of age by a month.  We discussed with Dominique Murphy that the family history is not highly consistent with a familial hereditary cancer syndrome, and we feel she is at low risk to harbor a gene mutation associated with such a condition. However, we are still proceeding with testing to provide the most comprehensive information in regard to hereditary cancer syndromes. Because of this she may have an out of pocket cost. We requested the lab do a benefits investigation for the specific out-of-pocket cost. At that time the patient will determine if she wants to proceed with the current order or if she would like to pay the self-pay price of $475. We reviewed the characteristics, features and inheritance patterns of hereditary cancer syndromes. We also discussed genetic testing, including the appropriate family members to test, the process of testing, insurance coverage and turn-around-time for results. We discussed the implications of a negative,  positive and/or variant of uncertain significant result. We recommended Dominique Murphy pursue genetic testing for the hereditary gene panel through [facility] The Hereditary Gene Panel offered by [facility] includes sequencing and/or deletion duplication testing of the following 42 genes: APC, ATM, AXIN2, BARD1, BMPR1A, BRCA1, BRCA2, BRIP1, CDH1, CDKN2A, CHEK2, DICER1, EPCAM, GREM1, KIT, MEN1, MLH1, MSH2, MSH6, MUTYH, NBN, NF1, PALB2, PDGFRA, PMS2, POLD1, POLE, PTEN, RAD50, RAD51C, RAD51D, SDHA, SDHB, SDHC, SDHD, SMAD4, SMARCA4. STK11, TP53, TSC1, TSC2, and VHL.   PLAN: After considering the risks, benefits, and limitations, Dominique Murphy  provided informed consent to pursue genetic testing and the blood sample was sent to [facility] for analysis of the Hereditary Gene Panel.  This panel offered by [facility] includes sequencing and/or deletion duplication testing of the following 42 genes: APC, ATM, AXIN2,  BARD1, BMPR1A, BRCA1, BRCA2, BRIP1, CDH1, CDKN2A, CHEK2, DICER1, EPCAM, GREM1, KIT, MEN1, MLH1, MSH2, MSH6, MUTYH, NBN, NF1, PALB2, PDGFRA, PMS2, POLD1, POLE, PTEN, RAD50, RAD51C, RAD51D, SDHA, SDHB, SDHC, SDHD, SMAD4, SMARCA4. STK11, TP53, TSC1, TSC2, and VHL.  Results should be available within approximately 2-3 weeks' time, at which point they will be disclosed by telephone to Dominique Murphy, as will any additional recommendations warranted by these results. Dominique Murphy will receive a summary of her genetic counseling visit and a copy of her results once available. This information will also be available in Epic. We encouraged Dominique Murphy to remain in contact with cancer genetics annually so that we can continuously update the family history and inform her of any changes in cancer genetics and testing that may be of benefit for her family. Dominique Murphy questions were answered to her satisfaction today. Our contact information was provided should additional questions or concerns arise.  Lastly, we encouraged Ms.  Murphy to remain in contact with cancer genetics annually so that we can continuously update the family history and inform her of any changes in cancer genetics and testing that may be of benefit for this family.   Ms.  Gemmill questions were answered to her satisfaction today. Our contact information was provided should additional questions or concerns arise. Thank you for the referral and allowing [place] to share in the care of your patient.   Karen P. Florene Glen, [place], [facility] Certified Genetic Counselor [email or web address] phone: [phone]  The patient was seen for a total of 60 minutes in face-to-face genetic counseling.  This patient was discussed with Drs. Magrinat, Lindi Adie and/or Burr Medico who agrees with the above.    _______________________________________________________________________ For Office Staff:  Number of people involved in session: 2 Was an Intern/ student involved with case: yes [employer]

## 2016-05-20 NOTE — Telephone Encounter (Signed)
Received order per Dr. Lindi Adie for oncotype testing on core bx. Requisition sen to pathology. Received by Varney Biles.

## 2016-05-21 ENCOUNTER — Other Ambulatory Visit: Payer: BLUE CROSS/BLUE SHIELD

## 2016-05-25 ENCOUNTER — Ambulatory Visit: Payer: BLUE CROSS/BLUE SHIELD | Admitting: Hematology and Oncology

## 2016-05-26 ENCOUNTER — Encounter: Payer: Self-pay | Admitting: Gynecology

## 2016-05-26 ENCOUNTER — Ambulatory Visit
Admission: RE | Admit: 2016-05-26 | Discharge: 2016-05-26 | Disposition: A | Discharge status: Home / Self Care | Payer: BLUE CROSS/BLUE SHIELD | Source: Ambulatory Visit | Attending: General Surgery | Admitting: General Surgery

## 2016-05-26 ENCOUNTER — Other Ambulatory Visit: Payer: Self-pay | Admitting: General Surgery

## 2016-05-26 ENCOUNTER — Other Ambulatory Visit: Payer: BLUE CROSS/BLUE SHIELD

## 2016-05-26 ENCOUNTER — Ambulatory Visit (INDEPENDENT_AMBULATORY_CARE_PROVIDER_SITE_OTHER): Payer: BLUE CROSS/BLUE SHIELD | Admitting: Gynecology

## 2016-05-26 VITALS — BP 116/74 | Ht 67.0 in | Wt 170.0 lb

## 2016-05-26 DIAGNOSIS — C50411 Malignant neoplasm of upper-outer quadrant of right female breast: Secondary | ICD-10-CM

## 2016-05-26 DIAGNOSIS — Z01419 Encounter for gynecological examination (general) (routine) without abnormal findings: Secondary | ICD-10-CM

## 2016-05-26 DIAGNOSIS — Z30431 Encounter for routine checking of intrauterine contraceptive device: Secondary | ICD-10-CM

## 2016-05-26 DIAGNOSIS — C50911 Malignant neoplasm of unspecified site of right female breast: Secondary | ICD-10-CM

## 2016-05-26 DIAGNOSIS — F4322 Adjustment disorder with anxiety: Secondary | ICD-10-CM | POA: Diagnosis not present

## 2016-05-26 MED ORDER — ZOLPIDEM TARTRATE 10 MG PO TABS: 10.0000 mg | ORAL_TABLET | Freq: Every evening | ORAL | Status: DC | PRN

## 2016-05-26 MED ORDER — ALPRAZOLAM 0.5 MG PO TABS: 0.5000 mg | ORAL_TABLET | Freq: Every evening | ORAL | Status: DC | PRN

## 2016-05-26 MED ORDER — GADOBENATE DIMEGLUMINE 529 MG/ML IV SOLN
15.0000 mL | Freq: Once | INTRAVENOUS | Status: AC | PRN
  Administered 2016-05-26: 15 mL via INTRAVENOUS

## 2016-05-26 NOTE — Patient Instructions (Signed)
Follow up if you have any issues or questions about your IUD.

## 2016-05-26 NOTE — Addendum Note (Signed)
Addended by: Nelva Nay on: 05/26/2016 03:58 PM   Modules accepted: Orders, SmartSet

## 2016-05-26 NOTE — Progress Notes (Addendum)
Dominique Murphy [birth year] [id number]        45 y.o.  G1P1001  for annual exam.  Several issues noted below.  Past medical history,surgical history, problem list, medications, allergies, family history and social history were all reviewed and documented as reviewed in the EPIC chart.  ROS:  Performed with pertinent positives and negatives included in the history, assessment and plan.   Additional significant findings :  None   Exam: Dominique Murphy assistant Filed Vitals:   05/26/16 1427  BP: 116/74  Height: 5\' 7"  (1.702 m)  Weight: 170 lb (77.111 kg)   General appearance:  Normal affect, orientation and appearance. Skin: Grossly normal HEENT: Without gross lesions.  No cervical or supraclavicular adenopathy. Thyroid normal.  Lungs:  Clear without wheezing, rales or rhonchi Cardiac: RR, without RMG Abdominal:  Soft, nontender, without masses, guarding, rebound, organomegaly or hernia Breasts:  Examined lying and sitting without masses, retractions, discharge or axillary adenopathy. Pelvic:  Ext/BUS/Vagina Normal  Cervix normal. Pap smear done. IUD string visualized  Uterus anteverted, normal size, shape and contour, midline and mobile nontender   Adnexa without masses or tenderness    Anus and perineum normal   Rectovaginal normal sphincter tone without palpated masses or tenderness.    Assessment/Plan:  45 y.o. G34P1001 female for annual exam no menses, Mirena IUD.   1. Mirena IUD 02/2014. Doing well without menses. Very recent diagnoses of the right breast cancer.  Undergoing evaluation now but reports MRI that was just done is negative except for the area of known cancer. Biopsy was estrogen/progesterone positive. She discussed her Mirena IUD with her oncologist most recently and he felt comfortable for her to keep it in place.  The patient was called and asked to schedule this appointment when diagnosis of breast cancer was realized through our office. I wanted to discuss with  her the issues of Mirena, progesterone coating and issues of progesterone absorption. The risks that the progesterone would have a negative effect as far as her breast cancer treatment and recurrence risk stimulating progesterone receptors was reviewed. Lack of prospective randomized data for a clear recommendation as far as removing versus maintaining the IUD but certainly from a theoretical standpoint maintaining any possible hormone source in a patient with progesterone/estrogen receptor positive breast cancer may have a negative impact. From her history her oncologist felt comfortable overall maintaining this as long as the patient wanted to do so. Also the patient had contacted her surgeon and asked him the question also and they were okay with this. At this point the patient declines removal regardless as she would prefer just to wait to get through the initial evaluation surgery without having to deal with contraception/bleeding. Alternatives for contraception to include replacement of her Mirena IUD with a copper-based IUD discussed. She actually has discussed this with a friend of hers who had breast cancer and had her Mirena IUD removed and replaced with a ParaGard.  Patient is clearly informed and understands the issues. At this point again she declines removal. She will follow up with me if she decides to have the Mirena removed or replaced with a ParaGard. She will follow up with me if she has any questions.  I will touch bases with her oncologist to make sure that he is comfortable with the Mirena IUD. 2. Pap smear/HPV 01/2014.  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 Pap smear done today. 3. Anxiety/insomnia. Patient having some issues with both due to her recent diagnosis  of breast cancer and the upcoming treatments. Ambien 10 mg #30 with 2 refills provided. Xanax 5 mg #30 with 2 refills provided to use as needed. 4. Health maintenance. No routine blood work done today. Patient has and  probably will have a number of blood tests in the future. Did have a marginally elevated cholesterol in the past. Of asked her whenever she has other blood work drawn elsewhere to ask for a lipid profile but I will save for the blood work today. Follow up for further discussion as far as her Mirena as patient feels needed. Otherwise follow up in one year,   Anastasio Auerbach MD, 3:06 PM 05/26/2016

## 2016-05-27 LAB — PAP IG W/ RFLX HPV ASCU

## 2016-06-02 ENCOUNTER — Other Ambulatory Visit: Payer: Self-pay | Admitting: General Surgery

## 2016-06-02 DIAGNOSIS — C50411 Malignant neoplasm of upper-outer quadrant of right female breast: Secondary | ICD-10-CM

## 2016-06-03 ENCOUNTER — Telehealth: Payer: Self-pay | Admitting: *Deleted

## 2016-06-03 ENCOUNTER — Other Ambulatory Visit: Payer: Self-pay | Admitting: *Deleted

## 2016-06-03 NOTE — Telephone Encounter (Signed)
  Oncology Nurse Navigator Documentation  Navigator Location: [facility] (06/03/16 1400) Navigator Encounter Type: Telephone;Diagnostic Results (06/03/16 1400) Telephone: [place] Call (06/03/16 1400)                                        Time Spent with Patient: 15 (06/03/16 1400)

## 2016-06-04 ENCOUNTER — Encounter: Payer: Self-pay | Admitting: Genetic Counselor

## 2016-06-04 DIAGNOSIS — Z1379 Encounter for other screening for genetic and chromosomal anomalies: Secondary | ICD-10-CM | POA: Insufficient documentation

## 2016-06-05 ENCOUNTER — Ambulatory Visit: Payer: Self-pay | Admitting: Genetic Counselor

## 2016-06-05 DIAGNOSIS — Z800 Family history of malignant neoplasm of digestive organs: Secondary | ICD-10-CM

## 2016-06-05 DIAGNOSIS — Z1379 Encounter for other screening for genetic and chromosomal anomalies: Secondary | ICD-10-CM

## 2016-06-05 DIAGNOSIS — C50411 Malignant neoplasm of upper-outer quadrant of right female breast: Secondary | ICD-10-CM

## 2016-06-05 NOTE — Progress Notes (Signed)
HPI: Ms. Roca was previously seen in the [place] clinic due to a personal and family history of cancer and concerns regarding a hereditary predisposition to cancer. Please refer to our prior cancer genetics clinic note for more information regarding Ms. Lorenzo's medical, social and family histories, and our assessment and recommendations, at the time. Ms. Hurrell recent genetic test results were disclosed to her, as were recommendations warranted by these results. These results and recommendations are discussed in more detail below.  FAMILY HISTORY:  We obtained a detailed, 4-generation family history.  Significant diagnoses are listed below: Family History  Problem Relation Age of Onset  . Colon cancer Father 32  . Heart disease Maternal Grandfather   . Skin cancer Sister 11  . Colon cancer Paternal Grandfather 77    PGF Brother    Patient has a 46 year old son who has never had any childhood cancer. Patient has a sister who is 46 years of age and has had skin cancer on her face at age 46. The sister has a 76 year old son who has not had any childhood cancer. Patient's mother is 94 years old with no history of cancer. Patient's mother is an only child. Maternal grandmother passed away at 35 and maternal grandfather passed away at 44. No cancer reported on maternal side of the family. Patient's father was diagnosed with colon cancer at 46 years of age and is currently living at 46 years of age. Patient's father has 11 siblings in 50-70s range and all are living cancer free except once sister who passed at 35 from drowning. None of the 67 paternal cousins are reported to have cancer and all are younger than the patient. Paternal grandmother is 46 years old. Paternal grandfather passed away at 34. Paternal grandfather's brother, Maurie Boettcher, had colon cancer at 46 years of age. No other cancer is reported on paternal side of the family.  Patient's maternal ancestors are of [place]  descent, and paternal ancestors are of [place] descent. There no reported Ashkenazi Jewish ancestry. There no known consanguinity.  GENETIC TEST RESULTS: At the time of Ms. Bartosiewicz's visit, we recommended she pursue genetic testing of the Common Hereditary cancer gene panel. The Hereditary Gene Panel offered by [facility] includes sequencing and/or deletion duplication testing of the following 42 genes: APC, ATM, AXIN2, BARD1, BMPR1A, BRCA1, BRCA2, BRIP1, CDH1, CDKN2A, CHEK2, DICER1, EPCAM, GREM1, KIT, MEN1, MLH1, MSH2, MSH6, MUTYH, NBN, NF1, PALB2, PDGFRA, PMS2, POLD1, POLE, PTEN, RAD50, RAD51C, RAD51D, SDHA, SDHB, SDHC, SDHD, SMAD4, SMARCA4. STK11, TP53, TSC1, TSC2, and VHL.  The report date is June 03, 2016.  Genetic testing was normal, and did not reveal a deleterious mutation in these genes. The test report has been scanned into EPIC and is located under the Molecular Pathology section of the Results Review tab.   We discussed with Ms. Nagy that since the current genetic testing is not perfect, it is possible there may be a gene mutation in one of these genes that current testing cannot detect, but that chance is small. We also discussed, that it is possible that another gene that has not yet been discovered, or that we have not yet tested, is responsible for the cancer diagnoses in the family, and it is, therefore, important to remain in touch with cancer genetics in the future so that we can continue to offer Ms. Wint the most up to date genetic testing.   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 At this time, it is unknown if this variant is associated with increased cancer risk or if this is a normal finding, but most variants such as this get reclassified to being inconsequential. It should not be used to make medical management decisions. With time, we suspect the lab will determine the significance of this variant, if any. If we do learn more about  it, we will try to contact Ms. Soules to discuss it further. However, it is important to stay in touch with [place] periodically and keep the address and phone number up to date.   CANCER SCREENING RECOMMENDATIONS: This result is reassuring and indicates that Ms. Godbolt likely does not have an increased risk for a future cancer due to a mutation in one of these genes. This normal test also suggests that Ms. Kawahara's cancer was most likely not due to an inherited predisposition associated with one of these genes.  Most cancers happen by chance and this negative test suggests that her cancer falls into this category.  We, therefore, recommended she continue to follow the cancer management and screening guidelines provided by her oncology and primary healthcare provider.   RECOMMENDATIONS FOR FAMILY MEMBERS: Women in this family might be at some increased risk of developing cancer, over the general population risk, simply due to the family history of cancer. We recommended women in this family have a yearly mammogram beginning at age 46, or 46 years younger than the earliest onset of cancer, an an annual clinical breast exam, and perform monthly breast self-exams. Women in this family should also have a gynecological exam as recommended by their primary provider. All family members should have a colonoscopy by age 15.  FOLLOW-UP: Lastly, we discussed with Ms. Picazo that cancer genetics is a rapidly advancing field and it is possible that new genetic tests will be appropriate for her and/or her family members in the future. We encouraged her to remain in contact with cancer genetics on an annual basis so we can update her personal and family histories and let her know of advances in cancer genetics that may benefit this family.   Our contact number was provided. Ms. Iams questions were answered to her satisfaction, and she knows she is welcome to call us at anytime with additional questions or concerns.   Roma Kayser, MS, [facility] Certified Genetic Counselor [email or web address]

## 2016-06-06 ENCOUNTER — Telehealth: Payer: Self-pay | Admitting: Hematology and Oncology

## 2016-06-06 NOTE — Telephone Encounter (Signed)
Lvm advising appt 8/24 @ 10am.

## 2016-06-08 ENCOUNTER — Telehealth: Payer: Self-pay | Admitting: Gynecology

## 2016-06-08 NOTE — Telephone Encounter (Signed)
I called the patient in follow up of our previous discussion of Mirena IUD in patient with recent diagnosis of breast cancer. I reviewed various literature sources and I read the following paragraph from Up-To-Date written May 2017:  "Contraception after breast cancer - In the absence of prospective data, we agree with the [place] guidelines for medical eligibility for contraceptive use and suggest avoiding hormonal contraception in women with a current or past history of breast cancer (particularly in those with hormone receptor-positive disease) (table 4). Clinicians should discuss the use of a nonhormonal contraceptive method (condom, diaphragm, copper IUD) and help the woman choose the method most consistent with her lifestyle and beliefs. While women with a history of breast cancer may wish to preserve fertility, they may not have an imminent desire to become pregnant. Potential options for contraception include barrier methods (eg, diaphragms, condoms) and the copper intrauterine device (IUD). A more extensive discussion of the topic of contraception is covered separately. (See "Contraceptive counseling and selection", section on 'History of cancer'.)  The safety and efficacy of hormonal contraception has not been well studied in women with breast cancer, as these women have traditionally been excluded from studies of hormonal contraceptives. The levonorgestrel-releasing intrauterine contraception device (LNG-IUD) has been evaluated in women with a history of breast cancer, although primarily as a method of endometrial protection from the effects of tamoxifen rather than as a contraceptive modality. Data are limited and inconclusive, but suggest an increased risk of cancer recurrence despite a low rate of systemic hormone absorption. This was shown in one retrospective cohort study of breast cancer survivors that evaluated the risk of cancer recurrence among cases (LNG-IUD users, n = 79)  and controls (those who did not use LNG-IUD, n = 120) [89]. The rate of breast cancer recurrence was higher in cases compared with matched controls (22 versus 17 percent, OR 1.86, 95% CI 0.86-4.00), although the difference was not statistically significant. (See "Managing the side effects of tamoxifen", section on 'Endometrial protection'.)"  The patient noted that she has been doing more reading and thinking about this and wants to have her Mirena IUD removed which I think is certainly the prudent course of action. We discussed alternative birth control to include considering a ParaGard IUD, barrier contraception, sterilization. She wants to think more of her alternatives but plans to call and make an appointment to have her IUD removed.

## 2016-06-12 ENCOUNTER — Other Ambulatory Visit: Payer: Self-pay | Admitting: Hematology and Oncology

## 2016-06-16 ENCOUNTER — Ambulatory Visit (INDEPENDENT_AMBULATORY_CARE_PROVIDER_SITE_OTHER): Payer: BLUE CROSS/BLUE SHIELD | Admitting: Gynecology

## 2016-06-16 ENCOUNTER — Other Ambulatory Visit: Payer: Self-pay | Admitting: Hematology and Oncology

## 2016-06-16 ENCOUNTER — Encounter: Payer: Self-pay | Admitting: Gynecology

## 2016-06-16 VITALS — BP 120/76

## 2016-06-16 DIAGNOSIS — Z30432 Encounter for removal of intrauterine contraceptive device: Secondary | ICD-10-CM

## 2016-06-16 NOTE — Patient Instructions (Signed)
Follow up when you are due for your annual exam  Follow up sooner if there are issues with her menses.

## 2016-06-16 NOTE — Progress Notes (Signed)
    Dominique Murphy Feb 08, 1971 [id number]        45 y.o.  G1P1001 presents to have her Mirena IUD removed due to recent diagnosis of breast cancer, estrogen/progesterone receptor positive.  Past medical history,surgical history, problem list, medications, allergies, family history and social history were all reviewed and documented in the EPIC chart.  Directed ROS with pertinent positives and negatives documented in the history of present illness/assessment and plan.  Exam: [name] assistant Vitals:   06/16/16 1252  BP: 120/76   General appearance:  Normal Abdomen soft nontender without masses guarding rebound Pelvic external BUS vagina normal. Cervix normal. IUD string visualized. Uterus anteverted normal size midline mobile nontender. Adnexa without masses or tenderness.  Procedure: Her Mirena IUD string was grasped with the [facility] forcep and her Mirena IUD was removed, shown to the patient and discarded.  Assessment/Plan:  45 y.o. G1P1001 for IUD removal. Patient and I again discussed contraceptive options which essentially nonhormonal or barrier, copper IUD and sterilization. I encouraged her to have her husband have a vasectomy. She's worried about her periods getting heavier see work before having the [place] IUD. We discussed various options with this to include possible endometrial ablation if they do indeed get very heavy. She'll follow up in one year when due for annual exam, sooner if any issues.    Anastasio Auerbach MD, 1:12 PM 06/16/2016

## 2016-06-25 ENCOUNTER — Encounter (HOSPITAL_BASED_OUTPATIENT_CLINIC_OR_DEPARTMENT_OTHER): Payer: Self-pay | Admitting: *Deleted

## 2016-06-25 ENCOUNTER — Encounter (HOSPITAL_COMMUNITY): Payer: BLUE CROSS/BLUE SHIELD

## 2016-06-30 ENCOUNTER — Ambulatory Visit
Admission: RE | Admit: 2016-06-30 | Discharge: 2016-06-30 | Disposition: A | Discharge status: Home / Self Care | Payer: BLUE CROSS/BLUE SHIELD | Source: Ambulatory Visit | Attending: General Surgery | Admitting: General Surgery

## 2016-06-30 DIAGNOSIS — C50411 Malignant neoplasm of upper-outer quadrant of right female breast: Secondary | ICD-10-CM

## 2016-06-30 NOTE — Anesthesia Preprocedure Evaluation (Addendum)
Anesthesia Evaluation  Patient identified by MRN, date of birth, ID band Patient awake    Reviewed: Allergy & Precautions, NPO status , Patient's Chart, lab work & pertinent test results  History of Anesthesia Complications Negative for: history of anesthetic complications  Airway Mallampati: II  TM Distance: >3 FB Neck ROM: Full    Dental no notable dental hx. (+) Dental Advisory Given   Pulmonary neg pulmonary ROS,    Pulmonary exam normal        Cardiovascular negative cardio ROS Normal cardiovascular exam     Neuro/Psych negative neurological ROS  negative psych ROS   GI/Hepatic negative GI ROS, Neg liver ROS,   Endo/Other  negative endocrine ROS  Renal/GU negative Renal ROS     Musculoskeletal   Abdominal   Peds  Hematology   Anesthesia Other Findings   Reproductive/Obstetrics                            Anesthesia Physical Anesthesia Plan  ASA: II  Anesthesia Plan: General   Post-op Pain Management: GA combined w/ Regional for post-op pain   Induction: Intravenous  Airway Management Planned: LMA  Additional Equipment:   Intra-op Plan:   Post-operative Plan: Extubation in OR  Informed Consent: I have reviewed the patients History and Physical, chart, labs and discussed the procedure including the risks, benefits and alternatives for the proposed anesthesia with the patient or authorized representative who has indicated his/her understanding and acceptance.   Dental advisory given  Plan Discussed with: CRNA and Anesthesiologist  Anesthesia Plan Comments:        Anesthesia Quick Evaluation

## 2016-06-30 NOTE — Progress Notes (Signed)
Pt given 8 oz carton of boost breeze with written and verbal instructions (teach back) to drink at 445 morning of surgery,  No other liquid after midnight. Pt voiced understanding

## 2016-07-01 ENCOUNTER — Ambulatory Visit (HOSPITAL_BASED_OUTPATIENT_CLINIC_OR_DEPARTMENT_OTHER)
Admission: RE | Admit: 2016-07-01 | Discharge: 2016-07-01 | Disposition: A | Discharge status: Home / Self Care | Payer: BLUE CROSS/BLUE SHIELD | Source: Ambulatory Visit | Attending: General Surgery | Admitting: General Surgery

## 2016-07-01 ENCOUNTER — Encounter (HOSPITAL_BASED_OUTPATIENT_CLINIC_OR_DEPARTMENT_OTHER): Payer: Self-pay | Admitting: Anesthesiology

## 2016-07-01 ENCOUNTER — Ambulatory Visit (HOSPITAL_COMMUNITY)
Admission: RE | Admit: 2016-07-01 | Discharge: 2016-07-01 | Disposition: A | Discharge status: Home / Self Care | Payer: BLUE CROSS/BLUE SHIELD | Source: Ambulatory Visit | Attending: General Surgery | Admitting: General Surgery

## 2016-07-01 ENCOUNTER — Ambulatory Visit
Admission: RE | Admit: 2016-07-01 | Discharge: 2016-07-01 | Disposition: A | Discharge status: Home / Self Care | Payer: BLUE CROSS/BLUE SHIELD | Source: Ambulatory Visit | Attending: General Surgery | Admitting: General Surgery

## 2016-07-01 ENCOUNTER — Encounter (HOSPITAL_BASED_OUTPATIENT_CLINIC_OR_DEPARTMENT_OTHER)
Admission: RE | Disposition: A | Discharge status: Home / Self Care | Payer: Self-pay | Source: Ambulatory Visit | Attending: General Surgery

## 2016-07-01 ENCOUNTER — Ambulatory Visit (HOSPITAL_BASED_OUTPATIENT_CLINIC_OR_DEPARTMENT_OTHER): Payer: BLUE CROSS/BLUE SHIELD | Admitting: Anesthesiology

## 2016-07-01 DIAGNOSIS — D241 Benign neoplasm of right breast: Secondary | ICD-10-CM | POA: Diagnosis not present

## 2016-07-01 DIAGNOSIS — N6011 Diffuse cystic mastopathy of right breast: Secondary | ICD-10-CM | POA: Diagnosis not present

## 2016-07-01 DIAGNOSIS — D0511 Intraductal carcinoma in situ of right breast: Secondary | ICD-10-CM | POA: Insufficient documentation

## 2016-07-01 DIAGNOSIS — C50411 Malignant neoplasm of upper-outer quadrant of right female breast: Secondary | ICD-10-CM

## 2016-07-01 DIAGNOSIS — C50911 Malignant neoplasm of unspecified site of right female breast: Secondary | ICD-10-CM | POA: Diagnosis present

## 2016-07-01 HISTORY — PX: BREAST LUMPECTOMY WITH RADIOACTIVE SEED AND SENTINEL LYMPH NODE BIOPSY: SHX6550

## 2016-07-01 SURGERY — BREAST LUMPECTOMY WITH RADIOACTIVE SEED AND SENTINEL LYMPH NODE BIOPSY
Anesthesia: General | Site: Breast | Laterality: Right

## 2016-07-01 MED ORDER — TRAMADOL HCL 50 MG PO TABS
ORAL_TABLET | ORAL | Status: AC
  Filled 2016-07-01: qty 1

## 2016-07-01 MED ORDER — LIDOCAINE 2% (20 MG/ML) 5 ML SYRINGE
INTRAMUSCULAR | Status: AC
  Filled 2016-07-01: qty 5

## 2016-07-01 MED ORDER — MIDAZOLAM HCL 2 MG/2ML IJ SOLN
INTRAMUSCULAR | Status: AC
  Filled 2016-07-01: qty 2

## 2016-07-01 MED ORDER — DEXAMETHASONE SODIUM PHOSPHATE 10 MG/ML IJ SOLN
INTRAMUSCULAR | Status: AC
  Filled 2016-07-01: qty 1

## 2016-07-01 MED ORDER — BUPIVACAINE HCL (PF) 0.25 % IJ SOLN
INTRAMUSCULAR | Status: DC | PRN
  Administered 2016-07-01: 13 mL

## 2016-07-01 MED ORDER — ONDANSETRON HCL 4 MG/2ML IJ SOLN
INTRAMUSCULAR | Status: DC | PRN
  Administered 2016-07-01: 4 mg via INTRAVENOUS

## 2016-07-01 MED ORDER — SCOPOLAMINE 1 MG/3DAYS TD PT72: 1.0000 | MEDICATED_PATCH | TRANSDERMAL | Status: DC

## 2016-07-01 MED ORDER — FENTANYL CITRATE (PF) 100 MCG/2ML IJ SOLN
INTRAMUSCULAR | Status: AC
  Filled 2016-07-01: qty 2

## 2016-07-01 MED ORDER — PROPOFOL 10 MG/ML IV BOLUS
INTRAVENOUS | Status: DC | PRN
  Administered 2016-07-01: 200 mg via INTRAVENOUS

## 2016-07-01 MED ORDER — FENTANYL CITRATE (PF) 100 MCG/2ML IJ SOLN
50.0000 ug | INTRAMUSCULAR | Status: AC | PRN
  Administered 2016-07-01: 50 ug via INTRAVENOUS
  Administered 2016-07-01: 25 ug via INTRAVENOUS
  Administered 2016-07-01: 50 ug via INTRAVENOUS

## 2016-07-01 MED ORDER — ONDANSETRON HCL 4 MG/2ML IJ SOLN
INTRAMUSCULAR | Status: AC
  Filled 2016-07-01: qty 2

## 2016-07-01 MED ORDER — TECHNETIUM TC 99M SULFUR COLLOID FILTERED
1.0000 | Freq: Once | INTRAVENOUS | Status: AC | PRN
  Administered 2016-07-01: 1 via INTRADERMAL

## 2016-07-01 MED ORDER — CEFAZOLIN SODIUM-DEXTROSE 2-4 GM/100ML-% IV SOLN
2.0000 g | INTRAVENOUS | Status: AC
  Administered 2016-07-01: 2 g via INTRAVENOUS

## 2016-07-01 MED ORDER — GLYCOPYRROLATE 0.2 MG/ML IJ SOLN: 0.2000 mg | Freq: Once | INTRAMUSCULAR | Status: DC | PRN

## 2016-07-01 MED ORDER — LACTATED RINGERS IV SOLN
INTRAVENOUS | Status: DC
  Administered 2016-07-01 (×3): via INTRAVENOUS

## 2016-07-01 MED ORDER — PROMETHAZINE HCL 12.5 MG PO TABS: 12.5000 mg | ORAL_TABLET | Freq: Four times a day (QID) | ORAL | 0 refills | Status: DC | PRN

## 2016-07-01 MED ORDER — BUPIVACAINE HCL (PF) 0.25 % IJ SOLN
INTRAMUSCULAR | Status: AC
  Filled 2016-07-01: qty 30

## 2016-07-01 MED ORDER — SCOPOLAMINE 1 MG/3DAYS TD PT72: 1.0000 | MEDICATED_PATCH | Freq: Once | TRANSDERMAL | Status: DC | PRN

## 2016-07-01 MED ORDER — MIDAZOLAM HCL 2 MG/2ML IJ SOLN
1.0000 mg | INTRAMUSCULAR | Status: DC | PRN
  Administered 2016-07-01 (×2): 1 mg via INTRAVENOUS

## 2016-07-01 MED ORDER — HEMOSTATIC AGENTS (NO CHARGE) OPTIME
TOPICAL | Status: DC | PRN
  Administered 2016-07-01: 1 via TOPICAL

## 2016-07-01 MED ORDER — TRAMADOL HCL 50 MG PO TABS: 50.0000 mg | ORAL_TABLET | Freq: Four times a day (QID) | ORAL | 0 refills | Status: DC | PRN

## 2016-07-01 MED ORDER — TRAMADOL HCL 50 MG PO TABS
50.0000 mg | ORAL_TABLET | Freq: Once | ORAL | Status: AC
Start: 2016-07-01 — End: 2016-07-01
  Administered 2016-07-01: 50 mg via ORAL

## 2016-07-01 MED ORDER — DEXAMETHASONE SODIUM PHOSPHATE 4 MG/ML IJ SOLN
INTRAMUSCULAR | Status: DC | PRN
  Administered 2016-07-01: 10 mg via INTRAVENOUS

## 2016-07-01 MED ORDER — HYDROMORPHONE HCL 1 MG/ML IJ SOLN: 0.2500 mg | INTRAMUSCULAR | Status: DC | PRN

## 2016-07-01 MED ORDER — BUPIVACAINE-EPINEPHRINE (PF) 0.5% -1:200000 IJ SOLN
INTRAMUSCULAR | Status: DC | PRN
  Administered 2016-07-01: 30 mL

## 2016-07-01 MED ORDER — PROPOFOL 500 MG/50ML IV EMUL
INTRAVENOUS | Status: AC
  Filled 2016-07-01: qty 50

## 2016-07-01 MED ORDER — PROMETHAZINE HCL 25 MG/ML IJ SOLN: 6.2500 mg | INTRAMUSCULAR | Status: DC | PRN

## 2016-07-01 MED ORDER — CEFAZOLIN SODIUM-DEXTROSE 2-4 GM/100ML-% IV SOLN
INTRAVENOUS | Status: AC
  Filled 2016-07-01: qty 100

## 2016-07-01 SURGICAL SUPPLY — 55 items
BINDER BREAST LRG (GAUZE/BANDAGES/DRESSINGS) IMPLANT
BINDER BREAST MEDIUM (GAUZE/BANDAGES/DRESSINGS) IMPLANT
BINDER BREAST XLRG (GAUZE/BANDAGES/DRESSINGS) IMPLANT
BINDER BREAST XXLRG (GAUZE/BANDAGES/DRESSINGS) IMPLANT
BLADE SURG 15 STRL LF DISP TIS (BLADE) ×1 IMPLANT
BLADE SURG 15 STRL SS (BLADE) ×2
CANISTER SUC SOCK COL 7IN (MISCELLANEOUS) IMPLANT
CANISTER SUCT 1200ML W/VALVE (MISCELLANEOUS) IMPLANT
CHLORAPREP W/TINT 26ML (MISCELLANEOUS) ×2 IMPLANT
CLIP TI WIDE RED SMALL 6 (CLIP) ×2 IMPLANT
COVER BACK TABLE 60X90IN (DRAPES) ×2 IMPLANT
COVER MAYO STAND STRL (DRAPES) ×2 IMPLANT
COVER PROBE W GEL 5X96 (DRAPES) ×2 IMPLANT
DECANTER SPIKE VIAL GLASS SM (MISCELLANEOUS) IMPLANT
DEVICE DUBIN W/COMP PLATE 8390 (MISCELLANEOUS) ×2 IMPLANT
DRAPE LAPAROSCOPIC ABDOMINAL (DRAPES) ×2 IMPLANT
DRAPE UTILITY XL STRL (DRAPES) ×2 IMPLANT
ELECT COATED BLADE 2.86 ST (ELECTRODE) ×2 IMPLANT
ELECT REM PT RETURN 9FT ADLT (ELECTROSURGICAL) ×2
ELECTRODE REM PT RTRN 9FT ADLT (ELECTROSURGICAL) ×1 IMPLANT
GLOVE BIO SURGEON STRL SZ7 (GLOVE) ×4 IMPLANT
GLOVE BIOGEL PI IND STRL 7.5 (GLOVE) ×1 IMPLANT
GLOVE BIOGEL PI INDICATOR 7.5 (GLOVE) ×1
GOWN STRL REUS W/ TWL LRG LVL3 (GOWN DISPOSABLE) ×2 IMPLANT
GOWN STRL REUS W/TWL LRG LVL3 (GOWN DISPOSABLE) ×4
ILLUMINATOR WAVEGUIDE N/F (MISCELLANEOUS) IMPLANT
KIT MARKER MARGIN INK (KITS) ×2 IMPLANT
LIGHT WAVEGUIDE WIDE FLAT (MISCELLANEOUS) IMPLANT
LIQUID BAND (GAUZE/BANDAGES/DRESSINGS) ×2 IMPLANT
NDL HYPO 25X1 1.5 SAFETY (NEEDLE) ×1 IMPLANT
NDL SAFETY ECLIPSE 18X1.5 (NEEDLE) IMPLANT
NEEDLE HYPO 18GX1.5 SHARP (NEEDLE)
NEEDLE HYPO 25X1 1.5 SAFETY (NEEDLE) ×2 IMPLANT
NS IRRIG 1000ML POUR BTL (IV SOLUTION) IMPLANT
PACK BASIN DAY SURGERY FS (CUSTOM PROCEDURE TRAY) ×2 IMPLANT
PENCIL BUTTON HOLSTER BLD 10FT (ELECTRODE) ×2 IMPLANT
SHEET MEDIUM DRAPE 40X70 STRL (DRAPES) IMPLANT
SLEEVE SCD COMPRESS KNEE MED (MISCELLANEOUS) ×2 IMPLANT
SPONGE LAP 4X18 X RAY DECT (DISPOSABLE) ×2 IMPLANT
STOCKINETTE IMPERVIOUS LG (DRAPES) IMPLANT
STRIP CLOSURE SKIN 1/2X4 (GAUZE/BANDAGES/DRESSINGS) ×2 IMPLANT
SUT ETHILON 2 0 FS 18 (SUTURE) IMPLANT
SUT MNCRL AB 4-0 PS2 18 (SUTURE) ×2 IMPLANT
SUT MON AB 5-0 PS2 18 (SUTURE) IMPLANT
SUT SILK 2 0 SH (SUTURE) IMPLANT
SUT VIC AB 2-0 SH 27 (SUTURE) ×2
SUT VIC AB 2-0 SH 27XBRD (SUTURE) ×1 IMPLANT
SUT VIC AB 3-0 SH 27 (SUTURE) ×2
SUT VIC AB 3-0 SH 27X BRD (SUTURE) ×1 IMPLANT
SUT VIC AB 5-0 PS2 18 (SUTURE) IMPLANT
SYR CONTROL 10ML LL (SYRINGE) ×2 IMPLANT
TOWEL OR 17X24 6PK STRL BLUE (TOWEL DISPOSABLE) ×2 IMPLANT
TOWEL OR NON WOVEN STRL DISP B (DISPOSABLE) ×2 IMPLANT
TUBE CONNECTING 20X1/4 (TUBING) IMPLANT
YANKAUER SUCT BULB TIP NO VENT (SUCTIONS) IMPLANT

## 2016-07-01 NOTE — Transfer of Care (Signed)
Immediate Anesthesia Transfer of Care Note  Patient: Dominique Murphy  Procedure(s) Performed: Procedure(s): RIGHT BREAST LUMPECTOMY WITH RADIOACTIVE SEED X'S 2 AND SENTINEL LYMPH NODE BIOPSY (Right)  Patient Location: PACU  Anesthesia Type:General  Level of Consciousness: sedated and patient cooperative  Airway & Oxygen Therapy: Patient Spontanous Breathing and Patient connected to face mask oxygen  Post-op Assessment: Report given to RN and Post -op Vital signs reviewed and stable  Post vital signs: Reviewed and stable  Last Vitals:  Vitals:   07/01/16 0820 07/01/16 0825  BP:    Pulse: 82 80  Resp: 15 16  Temp:      Last Pain:  Vitals:   07/01/16 0717  TempSrc: Oral         Complications: No apparent anesthesia complications

## 2016-07-01 NOTE — Interval H&P Note (Signed)
History and Physical Interval Note:  07/01/2016 8:28 AM  Dominique Murphy  has presented today for surgery, with the diagnosis of RIGHT BREAST CANCER  The various methods of treatment have been discussed with the patient and family. After consideration of risks, benefits and other options for treatment, the patient has consented to  Procedure(s): RIGHT BREAST LUMPECTOMY WITH RADIOACTIVE SEED X'S 2 AND SENTINEL LYMPH NODE BIOPSY (Right) as a surgical intervention .  The patient's history has been reviewed, patient examined, no change in status, stable for surgery.  I have reviewed the patient's chart and labs.  Questions were answered to the patient's satisfaction.     WAKEFIELD,MATTHEW

## 2016-07-01 NOTE — H&P (Signed)
45 yof who presents with new diagnosis of right breast cancer. she has no personal or family history of breast cancer. only fh of colon cancer in her father and she has already had csc. she has no breast complaints. she underwent mm that showed c density breasts and a right breast mass. left side was normal. she underwent additional views that confirmed spiculated mass in ruoq. US shows a mass 5 cm from nipple that measures 7x8x9 mm in size. 2.4 cm lateral to this mass there is another nodule that measures 5x4 mm. axillary US shows a single node with thickened cortex. she underwent biopsy of both breast masses and the axillary node. the axillary node is lymphoid tissue and benign. 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    Other Problems  Breast Cancer Lump In Breast  PSH Breast Biopsy Right. Cesarean Section - 1 Hip Surgery Right. Oral Surgery  Diagnostic Studies History  Colonoscopy 1-5 years ago Mammogram 1-3 years ago  Allergies Morphine Sulfate (Concentrate) *ANALGESICS - OPIOID* Percocet *ANALGESICS - OPIOID*  Medication History  No Current Medications Medications Reconciled  Social History  Alcohol use Occasional alcohol use. Caffeine use Coffee. No drug use Tobacco use Never smoker.  Family History  Colon Cancer Father. Melanoma Sister.  Pregnancy / Birth History  Age at menarche 45 years. Contraceptive History Intrauterine device. Gravida 1 Irregular periods Maternal age 46-30 Para 1  Review of Systems  General Not Present- Appetite Loss, Chills, Fatigue, Fever, Night Sweats, Weight Gain and Weight Loss. Skin Not Present- Change in Wart/Mole, Dryness, Hives, Jaundice, New Lesions, Non-Healing Wounds, Rash and Ulcer. HEENT Present- Ringing in the Ears. Not Present- Earache, Hearing Loss, Hoarseness, Nose Bleed, Oral Ulcers, Seasonal  Allergies, Sinus Pain, Sore Throat, Visual Disturbances, Wears glasses/contact lenses and Yellow Eyes. Breast Present- Breast Mass. Not Present- Breast Pain, Nipple Discharge and Skin Changes. Cardiovascular Not Present- Chest Pain, Difficulty Breathing Lying Down, Leg Cramps, Palpitations, Rapid Heart Rate, Shortness of Breath and Swelling of Extremities. Gastrointestinal Not Present- Abdominal Pain, Bloating, Bloody Stool, Change in Bowel Habits, Chronic diarrhea, Constipation, Difficulty Swallowing, Excessive gas, Gets full quickly at meals, Hemorrhoids, Indigestion, Nausea, Rectal Pain and Vomiting. Female Genitourinary Not Present- Frequency, Nocturia, Painful Urination, Pelvic Pain and Urgency. Musculoskeletal Not Present- Back Pain, Joint Pain, Joint Stiffness, Muscle Pain, Muscle Weakness and Swelling of Extremities. Neurological Not Present- Decreased Memory, Fainting, Headaches, Numbness, Seizures, Tingling, Tremor, Trouble walking and Weakness. Psychiatric Not Present- Anxiety, Bipolar, Change in Sleep Pattern, Depression, Fearful and Frequent crying. Endocrine Not Present- Cold Intolerance, Excessive Hunger, Hair Changes, Heat Intolerance, Hot flashes and New Diabetes. Hematology Not Present- Blood Thinners, Easy Bruising, Excessive bleeding, Gland problems, HIV and Persistent Infections.  Vitals  Weight: 170 lb Height: 66.5in Body Surface Area: 1.88 m Body Mass Index: 27.03 kg/m  Temp.: 46F(Temporal)  Pulse: 79 (Regular)  BP: 130/70 (Sitting, Left Arm, Standard)   Physical Exam General Mental Status-Alert. Orientation-Oriented X3. Eye Sclera/Conjunctiva - Bilateral-No scleral icterus. Chest and Lung Exam Chest and lung exam reveals -quiet, even and easy respiratory effort with no use of accessory muscles and on auscultation, normal breath sounds, no adventitious sounds and normal vocal resonance. Breast Nipples-No Discharge. Breast Lump-No Palpable  Breast Mass. Cardiovascular Cardiovascular examination reveals -on palpation PMI is normal in location and amplitude, no palpable S3 or S4. Normal cardiac borders. and normal heart sounds, regular rate and  rhythm with no murmurs. Lymphatic Head & Neck General Head & Neck Lymphatics: Bilateral - Description - Normal. Axillary General Axillary Region: Bilateral - Description - Normal. Note: no [place] adenopathy   Assessment & Plan  Right breast lumpectomy and ax sn biopsy

## 2016-07-01 NOTE — Op Note (Signed)
Preoperative diagnosis: Right breast cancer, clinical stage 1 Postoperative diagnosis: same as above Procedure: 1. Right breast seed guided lumpectomy times two 2. Right axillary sentinel node biopsy Surgeon Dr Serita Grammes Anes general with pectoral block EBL: minimal Comps none Specimen:  1. Right breast marked with paint 2. Right axillary nodes with highest count of 202 3. Additional medial, inferior, superior margins marked short superior, long lateral, double deep Sponge count correct at completion dispo to recovery stable  Indications: This is a 69 yof who has newly diagnosed right breast cancer times two. We discussed options and elected to perform double seed lumpectomy with sn biopsy.    Procedure: After informed consent was obtained the patient was taken to the OR. She was injected with technetium in the standard periareolar fashion. She underwent a pectoral block. She was given anitibiotics. SCDs were in place. She was prepped and draped in the standard sterile surgical fashion. A timeout was performed. I then made located the seeds in the right lateral breast.  I infiltrated marcaine and made a periareolar incision. I used the lighted retractor system to tunnel to the lesion. I then removed the seed with an attempt to get a clear margin. The first seed was in the first specimen and it appeared margins were clear.  The second seed was nearly in the skin. I removed this as well. The anterior and lateral margins are just skin at this point. . I did confirm removal of both clips and seeds with mammography.I did remove some additional margins as above. I placed clips in the cavity.Hemostasis was observed. I placed arista in the cavity. I closed the breast tissue with 2-0 vicryl. I closed the skin with 3-0 vicryl and 5-0 monocryl. Glue was placed. I then made an incision in the axilla. I went through the axillary fascia. I identified the sentinel nodes with the highest count as  above. There was no background radioactivity. I obtained hemostasis. I then closed the fascia with 2-0 vicryl. I placed arista in the entire cavity. The skin was closed with 3-0 vicryl and 4-0 monocryl. Glue and steristrips were placed A breast binder was placed. She was extubated and transferred to recovery stable

## 2016-07-01 NOTE — Anesthesia Procedure Notes (Deleted)
Anesthesia Procedure Image    

## 2016-07-01 NOTE — Anesthesia Postprocedure Evaluation (Signed)
Anesthesia Post Note  Patient: Dominique Murphy  Procedure(s) Performed: Procedure(s) (LRB): RIGHT BREAST LUMPECTOMY WITH RADIOACTIVE SEED X'S 2 AND SENTINEL LYMPH NODE BIOPSY (Right)  Patient location during evaluation: PACU Anesthesia Type: General Level of consciousness: sedated Pain management: pain level controlled Vital Signs Assessment: post-procedure vital signs reviewed and stable Respiratory status: spontaneous breathing and respiratory function stable Cardiovascular status: stable Anesthetic complications: no    Last Vitals:  Vitals:   07/01/16 1015 07/01/16 1030  BP: 116/75 114/77  Pulse: 62 (!) 57  Resp: 17 18  Temp:      Last Pain:  Vitals:   07/01/16 1030  TempSrc:   PainSc: 4                  SINGER,JAMES DANIEL

## 2016-07-01 NOTE — Anesthesia Procedure Notes (Addendum)
Anesthesia Regional Block:  Pectoralis block  Pre-Anesthetic Checklist: ,, timeout performed, Correct Patient, Correct Site, Correct Laterality, Correct Procedure, Correct Position, site marked, Risks and benefits discussed,  Surgical consent,  Pre-op evaluation,  At surgeon's request and post-op pain management  Laterality: Right  Prep: chloraprep       Needles:  Injection technique: Single-shot  Needle Type: Echogenic Stimulator Needle     Needle Length: 10cm 10 cm Needle Gauge: 21 and 21 G    Additional Needles:  Procedures: ultrasound guided (picture in chart) Pectoralis block Narrative:  Start time: 07/01/2016 8:06 AM End time: 07/01/2016 8:16 AM Injection made incrementally with aspirations every 5 mL.  Performed by: Personally

## 2016-07-01 NOTE — Anesthesia Procedure Notes (Signed)
Procedure Name: LMA Insertion Date/Time: 07/01/2016 8:44 AM Performed by: Lyndee Leo Pre-anesthesia Checklist: Patient identified, Emergency Drugs available, Suction available and Patient being monitored Patient Re-evaluated:Patient Re-evaluated prior to inductionOxygen Delivery Method: Circle system utilized Preoxygenation: Pre-oxygenation with 100% oxygen Intubation Type: IV induction Ventilation: Mask ventilation without difficulty LMA: LMA inserted LMA Size: 4.0 Number of attempts: 1 Airway Equipment and Method: Bite block Placement Confirmation: positive ETCO2 Tube secured with: Tape Dental Injury: Teeth and Oropharynx as per pre-operative assessment

## 2016-07-01 NOTE — Progress Notes (Signed)
Assisted Dr. Singer with right, ultrasound guided, pectoralis block. Side rails up, monitors on throughout procedure. See vital signs in flow sheet. Tolerated Procedure well. °

## 2016-07-01 NOTE — Discharge Instructions (Signed)
[place] Office Phone Number [phone]   POST OP INSTRUCTIONS  Always review your discharge instruction sheet given to you by the facility where your surgery was performed.  IF YOU HAVE DISABILITY OR FAMILY LEAVE FORMS, YOU MUST BRING THEM TO THE OFFICE FOR PROCESSING.  DO NOT GIVE THEM TO YOUR DOCTOR.  1. A prescription for pain medication may be given to you upon discharge.  Take your pain medication as prescribed, if needed.  If narcotic pain medicine is not needed, then you may take acetaminophen (Tylenol), naprosyn (Alleve) or ibuprofen (Advil) as needed. 2. Take your usually prescribed medications unless otherwise directed 3. If you need a refill on your pain medication, please contact your pharmacy.  They will contact our office to request authorization.  Prescriptions will not be filled after 5pm or on week-ends. 4. You should eat very light the first 24 hours after surgery, such as soup, crackers, pudding, etc.  Resume your normal diet the day after surgery. 5. Most patients will experience some swelling and bruising in the breast.  Ice packs and a good support bra will help.  Wear the breast binder provided or a sports bra for 72 hours day and night.  After that wear a sports bra during the day until you return to the office. Swelling and bruising can take several days to resolve.  6. It is common to experience some constipation if taking pain medication after surgery.  Increasing fluid intake and taking a stool softener will usually help or prevent this problem from occurring.  A mild laxative (Milk of Magnesia or Miralax) should be taken according to package directions if there are no bowel movements after 48 hours. 7. Unless discharge instructions indicate otherwise, you may remove your bandages 48 hours after surgery and you may shower at that time.  You may have steri-strips (small skin tapes) in place directly over the incision.  These strips should be left on the  skin for 7-10 days and will come off on their own.  If your surgeon used skin glue on the incision, you may shower in 24 hours.  The glue will flake off over the next 2-3 weeks.  Any sutures or staples will be removed at the office during your follow-up visit. 8. ACTIVITIES:  You may resume regular daily activities (gradually increasing) beginning the next day.  Wearing a good support bra or sports bra minimizes pain and swelling.  You may have sexual intercourse when it is comfortable. a. You may drive when you no longer are taking prescription pain medication, you can comfortably wear a seatbelt, and you can safely maneuver your car and apply brakes. b. RETURN TO WORK:  ______________________________________________________________________________________ 9. You should see your doctor in the office for a follow-up appointment approximately two weeks after your surgery.  Your doctors nurse will typically make your follow-up appointment when she calls you with your pathology report.  Expect your pathology report 3-4 business days after your surgery.  You may call to check if you do not hear from [place] after three days. 10. OTHER INSTRUCTIONS: _______________________________________________________________________________________________ _____________________________________________________________________________________________________________________________________ _____________________________________________________________________________________________________________________________________ _____________________________________________________________________________________________________________________________________  WHEN TO CALL DR WAKEFIELD: 1. Fever over 101.0 2. Nausea and/or vomiting. 3. Extreme swelling or bruising. 4. Continued bleeding from incision. 5. Increased pain, redness, or drainage from the incision.  The clinic staff is available to answer your questions during regular  business hours.  Please dont hesitate to call and ask to speak to one of the nurses for clinical concerns.  If you have a medical emergency, go to the nearest emergency room or call 911.  A surgeon from [facility] is always on call at the hospital.  For further questions, please visit [email or web address]     Post Anesthesia Home Care Instructions  Activity: Get plenty of rest for the remainder of the day. A responsible adult should stay with you for 24 hours following the procedure.  For the next 24 hours, DO NOT: -Drive a car -Paediatric nurse -Drink alcoholic beverages -Take any medication unless instructed by your physician -Make any legal decisions or sign important papers.  Meals: Start with liquid foods such as gelatin or soup. Progress to regular foods as tolerated. Avoid greasy, spicy, heavy foods. If nausea and/or vomiting occur, drink only clear liquids until the nausea and/or vomiting subsides. Call your physician if vomiting continues.  Special Instructions/Symptoms: Your throat may feel dry or sore from the anesthesia or the breathing tube placed in your throat during surgery. If this causes discomfort, gargle with warm salt water. The discomfort should disappear within 24 hours.  If you had a scopolamine patch placed behind your ear for the management of post- operative nausea and/or vomiting:  1. The medication in the patch is effective for 72 hours, after which it should be removed.  Wrap patch in a tissue and discard in the trash. Wash hands thoroughly with soap and water. 2. You may remove the patch earlier than 72 hours if you experience unpleasant side effects which may include dry mouth, dizziness or visual disturbances. 3. Avoid touching the patch. Wash your hands with soap and water after contact with the patch.     Regional Anesthesia Blocks  1. Numbness or the inability to move the "blocked" extremity may last from 3-48 hours after  placement. The length of time depends on the medication injected and your individual response to the medication. If the numbness is not going away after 48 hours, call your surgeon.  2. The extremity that is blocked will need to be protected until the numbness is gone and the  Strength has returned. Because you cannot feel it, you will need to take extra care to avoid injury. Because it may be weak, you may have difficulty moving it or using it. You may not know what position it is in without looking at it while the block is in effect.  3. For blocks in the legs and feet, returning to weight bearing and walking needs to be done carefully. You will need to wait until the numbness is entirely gone and the strength has returned. You should be able to move your leg and foot normally before you try and bear weight or walk. You will need someone to be with you when you first try to ensure you do not fall and possibly risk injury.  4. Bruising and tenderness at the needle site are common side effects and will resolve in a few days.  5. Persistent numbness or new problems with movement should be communicated to the surgeon or the [place] [phone] [place] [phone]).

## 2016-07-02 ENCOUNTER — Encounter (HOSPITAL_BASED_OUTPATIENT_CLINIC_OR_DEPARTMENT_OTHER): Payer: Self-pay | Admitting: General Surgery

## 2016-07-09 ENCOUNTER — Ambulatory Visit (HOSPITAL_BASED_OUTPATIENT_CLINIC_OR_DEPARTMENT_OTHER): Payer: BLUE CROSS/BLUE SHIELD | Admitting: Hematology and Oncology

## 2016-07-09 ENCOUNTER — Encounter: Payer: Self-pay | Admitting: Hematology and Oncology

## 2016-07-09 ENCOUNTER — Telehealth: Payer: Self-pay | Admitting: Hematology and Oncology

## 2016-07-09 DIAGNOSIS — C50411 Malignant neoplasm of upper-outer quadrant of right female breast: Secondary | ICD-10-CM

## 2016-07-09 NOTE — Progress Notes (Signed)
Patient Care Team: Chesley Noon, MD as PCP - General (Family Medicine)  DIAGNOSIS: Breast cancer of upper-outer quadrant of right female breast [facility])   Staging form: Breast, AJCC 7th Edition   - Clinical: No stage assigned - Unsigned  SUMMARY OF ONCOLOGIC HISTORY:   Breast cancer of upper-outer quadrant of right female breast ([place])   05/11/2016 Initial Diagnosis    Right breast biopsy 9:30 position 7cmfn: Fibrocystic change (5 mm); 5 cmfn: IDC grade 2, DCIS, LN axilla: Biopsy neg; ER 95%, PR 95%, HER-2 negative ratio 1.38, Ki-67 10%; right breast mass 9 mm, T1b N0 stage IA      07/01/2016 Surgery    Right lumpectomy: IDC grade 3, 0.5 cm, IgG DCIS, margins negative, 0/4 SLN negative, ER 95%, PR 95%, HER-2/neu negative, Ki-67 10%, T1 N0 stage IA       CHIEF COMPLIANT: Follow-up after recent lumpectomy  INTERVAL HISTORY: Dominique Murphy is a 45 year old with above-mentioned history of right breast cancer who recently underwent lumpectomy and is here today to discuss the results. She is recovering very well from the surgery. She has mild discomfort underneath the axilla.  REVIEW OF SYSTEMS:   Constitutional: Denies fevers, chills or abnormal weight loss Eyes: Denies blurriness of vision Ears, nose, mouth, throat, and face: Denies mucositis or sore throat Respiratory: Denies cough, dyspnea or wheezes Cardiovascular: Denies palpitation, chest discomfort Gastrointestinal:  Denies nausea, heartburn or change in bowel habits Skin: Denies abnormal skin rashes Lymphatics: Denies new lymphadenopathy or easy bruising Neurological:Denies numbness, tingling or new weaknesses Behavioral/Psych: Mood is stable, no new changes  Extremities: No lower extremity edema Breast: Recent right lumpectomy All other systems were reviewed with the patient and are negative.  I have reviewed the past medical history, past surgical history, social history and family history with the patient and they are  unchanged from previous note.  ALLERGIES:  is allergic to morphine and related and percocet [oxycodone-acetaminophen].  MEDICATIONS:  Current Outpatient Prescriptions  Medication Sig Dispense Refill  . ALPRAZolam (XANAX) 0.5 MG tablet Take 1 tablet (0.5 mg total) by mouth at bedtime as needed for anxiety. 30 tablet 2  . Multiple Vitamin (MULTIVITAMIN) tablet Take 1 tablet by mouth daily.    . promethazine (PHENERGAN) 12.5 MG tablet Take 1 tablet (12.5 mg total) by mouth every 6 (six) hours as needed for nausea or vomiting. 10 tablet 0  . tamoxifen (NOLVADEX) 20 MG tablet TAKE 1 TABLET (20 MG TOTAL) BY MOUTH DAILY. 30 tablet 0  . traMADol (ULTRAM) 50 MG tablet Take 1-2 tablets (50-100 mg total) by mouth every 6 (six) hours as needed. 30 tablet 0  . zolpidem (AMBIEN) 10 MG tablet Take 1 tablet (10 mg total) by mouth at bedtime as needed for sleep. 30 tablet 2   No current facility-administered medications for this visit.     PHYSICAL EXAMINATION: ECOG PERFORMANCE STATUS: 1 - Symptomatic but completely ambulatory  Vitals:   07/09/16 1032  BP: 112/78  Pulse: 71  Resp: 18  Temp: 98.4 F (36.9 C)   Filed Weights   07/09/16 1032  Weight: 167 lb 6.4 oz (75.9 kg)    GENERAL:alert, no distress and comfortable SKIN: skin color, texture, turgor are normal, no rashes or significant lesions EYES: normal, Conjunctiva are pink and non-injected, sclera clear OROPHARYNX:no exudate, no erythema and lips, buccal mucosa, and tongue normal  NECK: supple, thyroid normal size, non-tender, without nodularity LYMPH:  no palpable lymphadenopathy in the cervical, axillary or inguinal LUNGS:  clear to auscultation and percussion with normal breathing effort HEART: regular rate & rhythm and no murmurs and no lower extremity edema ABDOMEN:abdomen soft, non-tender and normal bowel sounds MUSCULOSKELETAL:no cyanosis of digits and no clubbing  NEURO: alert & oriented x 3 with fluent speech, no focal  motor/sensory deficits EXTREMITIES: No lower extremity edema I don't appreciate a reasonable sugar of 1 units. He used to the study is open BX I think she revealed age-related one of the main issues is seen on the ECOG of 1 was 1 of the study requirement OF perfect yet LABORATORY DATA:  I have reviewed the data as listed   Chemistry      Component Value Date/Time   NA 138 01/31/2014 1042   K 4.4 01/31/2014 1042   CL 103 01/31/2014 1042   CO2 27 01/31/2014 1042   BUN 18 01/31/2014 1042   CREATININE 0.76 01/31/2014 1042      Component Value Date/Time   CALCIUM 9.2 01/31/2014 1042   ALKPHOS 66 01/31/2014 1042   AST 26 01/31/2014 1042   ALT 19 01/31/2014 1042   BILITOT 0.3 01/31/2014 1042       Lab Results  Component Value Date   WBC 6.3 01/26/2013   HGB 12.6 01/26/2013   HCT 37.6 01/26/2013   MCV 90.8 01/26/2013   PLT 301 01/26/2013   NEUTROABS 3.3 01/26/2013     ASSESSMENT & PLAN:  Breast cancer of upper-outer quadrant of right female breast ([facility]) Right lumpectomy 07/01/2016: IDC grade 3, 0.5 cm, IgG DCIS, margins negative, 0/4 SLN negative, ER 95%, PR 95%, HER-2/neu negative, Ki-67 10%, T1 N0 stage IA Patient had started tamoxifen 20 mg daily 05/19/2016 because surgery was slightly delayed. Oncotype DX score 12, 8% risk of recurrence  Tamoxifen toxicities: Denies any hot flashes or myalgias.  Recommendations: 1. Adjuvant radiation therapy followed by 2. Adjuvant antiestrogen therapy with tamoxifen for 5-10 years  Return to clinic in 4 months for follow-up after radiation therapy    Rulon Eisenmenger, MD 07/09/16

## 2016-07-09 NOTE — Telephone Encounter (Signed)
appt made and pt will view appt on MyChart

## 2016-07-09 NOTE — Assessment & Plan Note (Signed)
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 Tamoxifen toxicities:  Recommendations: 1. Oncotype DX testing to determine if chemotherapy would be of any benefit followed by 2. Adjuvant radiation therapy followed by 3. Adjuvant antiestrogen therapy  Return to clinic based on Oncotype test results. We will make appointments accordingly. The Oncotype is low risk, I will see her back in 3 months after radiation for follow-up.

## 2016-07-14 ENCOUNTER — Other Ambulatory Visit: Payer: Self-pay | Admitting: Hematology and Oncology

## 2016-07-14 NOTE — Progress Notes (Signed)
Follow up New Consult Right Breast Cancer  Diagnosis 07/01/2016:  DR. MATTHEW WAKEFIELD, MD  1. Breast, lumpectomy, Right - FIBROCYSTIC CHANGES.- FIBROADENOMA.- HEALING BIOPSY SITE.- THERE IS NO EVIDENCE OF MALIGNANCY.- SEE COMMENT. 2. Breast, lumpectomy, Right - INVASIVE DUCTAL CARCINOMA GRADE III/III, SPANNING 0.5 CM.- INVASIVE CARCINOMA IS BROADLY PRESENT AT THE MEDIAL MARGIN OF SPECIMEN #2.- DUCTAL CARCINOMA IN SITU WITH CALCIFICATIONS, INTERMEDIATE GRADE.- DUCTAL CARCINOMA IN SITU IS FOCALLY 0.1 CM TO THE MEDIAL MARGIN OF SPECIMEN # 2.- SEE ONCOLOGY TABLE BELOW. 3. Breast, excision, Right additional Medial Margin - BENIGN BREAST PARENCHYMA.- THERE IS NO EVIDENCE OF MALIGNANCY.- SEE COMMENT. 4. Breast, excision, Right additional Superior Margin - BENIGN BREAST PARENCHYMA.- THERE IS NO EVIDENCE OF MALIGNANCY.- SEE COMMENT. 5. Breast, excision, Right additional Inferior Margin - BENIGN BREAST PARENCHYMA- THERE IS NO EVIDENCE OF MALIGNANCY.- SEE COMMENT. 6. Lymph node, sentinel, biopsy, Right Axillary - THERE IS NO EVIDENCE OF CARCINOMA IN 1 OF 1 LYMPH NODE (0/1). 7. Lymph node, sentinel, biopsy, Right axillary 1 of 4 Diagnosis(continued) - THERE IS NO EVIDENCE OF CARCINOMA IN 1 OF 1 LYMPH NODE (0/1). 8. Lymph node, sentinel, biopsy, Right axillary - THERE IS NO EVIDENCE OF CARCINOMA IN 1 OF 1 LYMPH NODE (0/1). 9. Lymph node, sentinel, biopsy, Right axillary - THERE IS NO EVIDENCE OF CARCINOMA IN 1 OF 1 LYMPH NODE (0/1).  07/10/16:Dr. Rolm Bookbinder, MD office visit drained 70cc serous fluid, drained 90cc from riht breast on Monday 07/13/16, and sees Dr. Donne Hazel again this Friday 07/17/16, incsison opened under axilla, has a bandaid covering, and on breast near nipple has steri strips covering incision,  Patient has numbness under axilla ,  BP 125/83 (BP Location: Left Arm, Patient Position: Sitting, Cuff Size: Normal)   Pulse 76   Temp 97.8 F (36.6 C) (Oral)   Resp 18   Ht 5\' 7"   (1.702 m)   Wt 167 lb 9.6 oz (76 kg)   BMI 26.25 kg/m   Wt Readings from Last 3 Encounters:  07/15/16 167 lb 9.6 oz (76 kg)  07/09/16 167 lb 6.4 oz (75.9 kg)  07/01/16 169 lb (76.7 kg)

## 2016-07-15 ENCOUNTER — Ambulatory Visit: Payer: BLUE CROSS/BLUE SHIELD | Admitting: Radiation Oncology

## 2016-07-15 ENCOUNTER — Ambulatory Visit
Admission: RE | Admit: 2016-07-15 | Discharge: 2016-07-15 | Disposition: A | Discharge status: Home / Self Care | Payer: BLUE CROSS/BLUE SHIELD | Source: Ambulatory Visit | Attending: Radiation Oncology | Admitting: Radiation Oncology

## 2016-07-15 ENCOUNTER — Encounter: Payer: Self-pay | Admitting: Radiation Oncology

## 2016-07-15 VITALS — BP 125/83 | HR 76 | Temp 97.8°F | Resp 18 | Ht 67.0 in | Wt 167.6 lb

## 2016-07-15 DIAGNOSIS — C50411 Malignant neoplasm of upper-outer quadrant of right female breast: Secondary | ICD-10-CM | POA: Diagnosis not present

## 2016-07-15 DIAGNOSIS — R200 Anesthesia of skin: Secondary | ICD-10-CM | POA: Insufficient documentation

## 2016-07-15 NOTE — Progress Notes (Signed)
Please see the Nurse Progress Note in the MD Initial Consult Encounter for this patient. 

## 2016-07-15 NOTE — Progress Notes (Signed)
Radiation Oncology         [phone] ________________________________  Name: Dominique Murphy MRN: [id number]  Date: 07/15/2016  DOB: 1971/04/17  Follow-Up Visit Note  CC: Chesley Noon, MD  Rolm Bookbinder, MD  Diagnosis:   Breast cancer of upper-outer quadrant of right female breast [facility]):  Staging - [id number]  Interval Since Last Radiation:  n/a   Narrative:  The patient returns today for routine follow-up. On 07/10/16 Dr. Donne Hazel drained 70 cc serous fluid. On Monday, 07/13/16 90 cc of fluid was drained from her right breast. Patient will visit Dr. Donne Hazel again on 07/17/16. Patient notes numbness under axilla.                              ALLERGIES:  is allergic to morphine and related and percocet [oxycodone-acetaminophen].  Meds: Current Outpatient Prescriptions  Medication Sig Dispense Refill  . ALPRAZolam (XANAX) 0.5 MG tablet Take 1 tablet (0.5 mg total) by mouth at bedtime as needed for anxiety. 30 tablet 2  . Multiple Vitamin (MULTIVITAMIN) tablet Take 1 tablet by mouth daily.    . traMADol (ULTRAM) 50 MG tablet Take 1-2 tablets (50-100 mg total) by mouth every 6 (six) hours as needed. 30 tablet 0  . promethazine (PHENERGAN) 12.5 MG tablet Take 1 tablet (12.5 mg total) by mouth every 6 (six) hours as needed for nausea or vomiting. (Patient not taking: Reported on 07/15/2016) 10 tablet 0  . tamoxifen (NOLVADEX) 20 MG tablet TAKE 1 TABLET (20 MG TOTAL) BY MOUTH DAILY. (Patient not taking: Reported on 07/15/2016) 30 tablet 0  . zolpidem (AMBIEN) 10 MG tablet Take 1 tablet (10 mg total) by mouth at bedtime as needed for sleep. 30 tablet 2   No current facility-administered medications for this encounter.     Physical Findings: The patient is in no acute distress. Patient is alert and oriented.  height is _0  (1.702 m) and weight is 167 lb 9.6 oz (76 kg). Her oral temperature is 97.8 F (36.6 C). Her blood pressure is 125/83 and her pulse is 76. Her respiration is 18. .     Lab Findings: Lab Results  Component Value Date   WBC 6.3 01/26/2013   HGB 12.6 01/26/2013   HCT 37.6 01/26/2013   MCV 90.8 01/26/2013   PLT 301 01/26/2013     Radiographic Findings: Nm Sentinel Node Inj-no Rpt (breast)  Result Date: 07/01/2016 CLINICAL DATA: right axillary sn biopsy Sulfur colloid was injected intradermally by the nuclear medicine technologist for breast cancer sentinel node localization.   Mm Breast Surgical Specimen  Result Date: 07/01/2016 CLINICAL DATA:  Post surgical excision of right breast lesion. EXAM: SPECIMEN RADIOGRAPH OF THE RIGHT BREAST COMPARISON:  Previous exam(s). FINDINGS: Status post excision of the right breast. The radioactive seed and coil shaped biopsy marker clip are present, completely intact, and were marked for pathology. IMPRESSION: Specimen radiograph of the right breast. Electronically Signed   By: Altamese Cabal M.D.   On: 07/01/2016 13:14   Mm Breast Surgical Specimen  Result Date: 07/01/2016 CLINICAL DATA:  Post surgical removal of right breast lesion. EXAM: SPECIMEN RADIOGRAPH OF THE RIGHT BREAST COMPARISON:  Previous exam(s). FINDINGS: Status post excision of the right breast. The radioactive seed and ribbon shaped biopsy marker clip are present, completely intact, and were marked for pathology. IMPRESSION: Specimen radiograph of the right breast. Electronically Signed   By: Altamese Cabal M.D.   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 EXAM: MAMMOGRAPHIC GUIDED RADIOACTIVE SEED LOCALIZATION OF THE RIGHT BREAST COMPARISON:  Previous exam(s). FINDINGS: Patient presents for radioactive seed localization prior to right lumpectomy. I met with the patient and we discussed the procedure of seed localization including benefits and alternatives. We  discussed the high likelihood of a successful procedure. We discussed the risks of the procedure including infection, bleeding, tissue injury and further surgery. We discussed the low dose of radioactivity involved in the procedure. Informed, written consent was given. The usual time-out protocol was performed immediately prior to the procedure. Using mammographic guidance, sterile technique, 1% lidocaine and an I-125 radioactive seed, the recently placed coil shaped biopsy marker clip in the upper-outer quadrant of the right breast was localized using a lateral approach. The follow-up mammogram images confirm the seed in the expected location and were marked for Dr. Donne Hazel. Follow-up survey of the patient confirms presence of the radioactive seed. Order number of I-125 seed:  [id number]. Total activity:  4.098 millicurie  Reference Date: 06/24/2016 The patient tolerated the procedure well and was released from the [place]. She was given instructions regarding seed removal. IMPRESSION: Radioactive seed localization right breast. No apparent complications. Electronically Signed   By: Claudie Revering M.D.   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 A second biopsy in the 9:30 o'clock position of the right breast, 7 cm from the nipple, yielded fibrocystic changes, felt to be discordant with the imaging findings. Excision of this area was also recommended. EXAM: MAMMOGRAPHIC GUIDED RADIOACTIVE SEED LOCALIZATION OF THE RIGHT BREAST COMPARISON:  Previous exam(s). FINDINGS: Patient presents for radioactive seed localization prior to right breast surgical excision. I met with the patient and we discussed the procedure of seed localization including benefits and alternatives. We discussed the high likelihood  of a successful procedure. We discussed the risks of the procedure including infection, bleeding, tissue injury and further surgery. We discussed the low dose of radioactivity involved in the procedure. Informed, written consent was given. The usual time-out protocol was performed immediately prior to the procedure. Using mammographic guidance, sterile technique, 1% lidocaine and an I-125 radioactive seed, the recently placed ribbon shaped biopsy marker clip in the upper-outer quadrant of the right breast was localized using a lateral approach. The follow-up mammogram images confirm the seed in the expected location and were marked for Dr. Donne Hazel. Follow-up survey of the patient confirms presence of the radioactive seed. Order number of I-125 seed:  [id number]. Total activity:  5.621 millicurie  Reference Date: 06/24/2016 The patient tolerated the procedure well and was released from the [place]. She was given instructions regarding seed removal. IMPRESSION: Radioactive seed localization right breast. No apparent complications. Electronically Signed   By: Claudie Revering M.D.   On: 06/30/2016 14:58    Impression:  Patient is status post lumpectomy with negative margins, no lymph nodes involved. Low onco-type score.Patient is a good candiate for adjuvent radiation treatment to right breast.   Plan: Patient has been scheduled for simulation in two weeks. We anticipate she will not need any further drainage of  the seroma in that time frame. I anticipate treating the patient for 6 1/2 weeks with adjuvant radiation treatment  The patient was seen today for 15 minutes, with the majority of the time spent counseling the patient on his diagnosis of cancer and coordinating his care.    Jodelle Gross, M.D., Ph.D.  This document serves as a record of services personally performed by Kyung Rudd, MD. It was created on his behalf by Bethann Humble, a trained medical scribe. The creation of this record is based on  the scribe's personal observations and the provider's statements to them. This document has been checked and approved by the attending provider.

## 2016-07-21 ENCOUNTER — Ambulatory Visit: Payer: BLUE CROSS/BLUE SHIELD | Attending: General Surgery | Admitting: Physical Therapy

## 2016-07-21 DIAGNOSIS — M25511 Pain in right shoulder: Secondary | ICD-10-CM | POA: Diagnosis present

## 2016-07-21 DIAGNOSIS — Z483 Aftercare following surgery for neoplasm: Secondary | ICD-10-CM

## 2016-07-21 DIAGNOSIS — M25611 Stiffness of right shoulder, not elsewhere classified: Secondary | ICD-10-CM

## 2016-07-21 NOTE — Therapy (Signed)
[place], [place], [address] Phone: [phone]   Fax:  [phone]  Physical Therapy Evaluation  Patient Details  Name: Dominique Murphy MRN: [id number] Date of Birth: 05/27/1971 Referring Provider: Dr. Rolm Bookbinder  Encounter Date: 07/21/2016      PT End of Session - 07/21/16 1446    Visit Number 1   Number of Visits 1   PT Start Time [id number]   PT Stop Time 1440   PT Time Calculation (min) 52 min   Activity Tolerance Patient tolerated treatment well   Behavior During Therapy [facility] for tasks assessed/performed      Past Medical History:  Diagnosis Date  . Allergy   . Breast cancer ([place]) 05/11/16   Right Breast   . CIN I (cervical intraepithelial neoplasia I) 02/2013   LGSIL on colposcopic biopsy    Past Surgical History:  Procedure Laterality Date  . [place]  2002  . BREAST LUMPECTOMY WITH RADIOACTIVE SEED AND SENTINEL LYMPH NODE BIOPSY Right 07/01/2016   Procedure: RIGHT BREAST LUMPECTOMY WITH RADIOACTIVE SEED X'S 2 AND SENTINEL LYMPH NODE BIOPSY;  Surgeon: Rolm Bookbinder, MD;  Location: [facility];  Service: General;  Laterality: Right;  . CESAREAN SECTION  2000  . COLPOSCOPY    . MIRENA     Inserted 02/20/2014  . WISDOM TOOTH EXTRACTION      There were no vitals filed for this visit.       Subjective Assessment - 07/21/16 1353    Subjective "I wasn't sure I should even come to this appointment because I have a seroma and I've been drained four times since my surgery 07/01/16.  After I'm drained, I move my arm just fine."     Pertinent History Right breast cancer with lumpectomy and SLNB 07/01/16; has had four seroma drainings since then.  Expects to start radiation soon.  No other health issues.   Patient Stated Goals learn exercise that I can do at home if needed   Currently in Pain? Yes   Pain Score 7    Pain Location Axilla  inferior to SLBN  incision   Pain Orientation Right   Pain Descriptors / Indicators Aching;Constant;Sharp   Aggravating Factors  raising arm or having arm resting on it, moving the wrong way   Pain Relieving Factors nothing            [facility] PT Assessment - 07/21/16 0001      Assessment   Medical Diagnosis right breast cancer s/p lumpectomy    Referring Provider Dr. Rolm Bookbinder   Onset Date/Surgical Date 07/01/16     Precautions   Precautions Other (comment)   Precaution Comments cancer      Restrictions   Weight Bearing Restrictions No     Balance Screen   Has the patient fallen in the past 6 months No   Has the patient had a decrease in activity level because of a fear of falling?  No   Is the patient reluctant to leave their home because of a fear of falling?  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   Leisure plays tennis 3x/week for 90 minutes     Cognition   Overall Cognitive Status  Within Functional Limits for tasks assessed     Observation/Other Assessments   Skin Integrity incision around nipple healing well with one steri strip in place; SLNB incision also healing, with slight pinkness of skin around there   Quick DASH  27.27     Posture/Postural Control   Posture/Postural Control Postural limitations   Postural Limitations Rounded Shoulders;Forward head     ROM / Strength   AROM / PROM / Strength AROM     AROM   AROM Assessment Site Shoulder   Right/Left Shoulder Right;Left   Right Shoulder Flexion --  pt. reports better motion once drained   Right Shoulder ABduction 154 Degrees   Right Shoulder Internal Rotation --  WFL in sitting   Right Shoulder External Rotation --  slightly limited in sitting   Left Shoulder Flexion 155 Degrees   Left  Shoulder ABduction 173 Degrees   Left Shoulder Internal Rotation --  WFL in sitting   Left Shoulder External Rotation --  WFL in sitting           LYMPHEDEMA/ONCOLOGY QUESTIONNAIRE - 07/21/16 1417      Type   Cancer Type right breast     Surgeries   Lumpectomy Date 07/01/16   Sentinel Lymph Node Biopsy Date 07/01/16   Number Lymph Nodes Removed 4     Treatment   Active Radiation Treatment No  but expects to start soon     Lymphedema Assessments   Lymphedema Assessments Upper extremities     Right Upper Extremity Lymphedema   10 cm Proximal to Olecranon Process 28.3 cm   Olecranon Process 24.3 cm   10 cm Proximal to Ulnar Styloid Process 21.3 cm   Just Proximal to Ulnar Styloid Process 15.8 cm   Across Hand at [employer] 18.1 cm   At [place] of 2nd Digit 6 cm     Left Upper Extremity Lymphedema   10 cm Proximal to Olecranon Process 27.4 cm   Olecranon Process 24 cm   10 cm Proximal to Ulnar Styloid Process 20.5 cm   Just Proximal to Ulnar Styloid Process 15 cm   Across Hand at [employer] 18 cm   At [place] of 2nd Digit 6 cm           Quick Dash - 07/21/16 0001    Open a tight or new jar No difficulty   Do heavy household chores (wash walls, wash floors) Mild difficulty   Carry a shopping bag or briefcase No difficulty   Wash your back No difficulty   Use a knife to cut food No difficulty   Recreational activities in which you take some force or impact through your arm, shoulder, or hand (golf, hammering, tennis) Moderate difficulty   During the past week, to what extent has your arm, shoulder or hand problem interfered with your normal social activities with family, friends, neighbors, or groups? Modererately   During the past week, to what extent has your arm, shoulder or hand problem limited your work or other regular daily activities Slightly   Arm, shoulder, or hand pain. Severe   Tingling (pins and needles) in your arm, shoulder, or hand None    Difficulty Sleeping Severe difficulty   DASH Score 27.27 %                     PT Education - 07/21/16 1445    Education provided Yes   Education Details brief education on ABC class stretches,  lymphedema risk reduction and her low lymphedema risk, obtaining a compression sleeve, working on returning to tennis with gradual increase in tennis activity (without racket, then with racket but no ball, then adding resistance, shorter to longer times)   Person(s) Educated Patient   Methods Explanation;Handout  ABC class handout   Comprehension Verbalized understanding                [facility] Goals - 07/21/16 1453      [place] Term Goal  #1   Title Patient will be knowledgeable about AAROM home exercise program for right shoulder   Status Achieved     [facility] Long Term Goal  #2   Title Patient will have awareness of lymphedema risk and risk reduction practices   Status Achieved            Plan - 07/21/16 1447    Clinical Impression Statement Patient who is s/p right lumpectomy and SLNB on 07/01/16 and is doing well except for a persistent seroma that she has had drained four times since then.  Her right shoulder active flexion and abduction are about 20 degrees less than the left, but she reports that her seroma is again full and that after she has it drained, her AROM improves quite a bit.  Her UE circumference measurements show slight differences, the right a little bigger than the left, but not enough to indicate any problem currently.  She is right-handed.   Rehab Potential Excellent   Clinical Impairments Affecting Rehab Potential right axillary seroma that is uncomfortable and limits motion in right shoulder.   PT Frequency One time visit   PT Treatment/Interventions ADLs/Self Care Home Management;Patient/family education   PT Next Visit Plan None at this time; patient should do well with information given today, especially as seroma heals   PT Home Exercise  Plan right shoulder AAROM as per ABC class handout   Recommended Other Services consider attending ABC class   Consulted and Agree with Plan of Care Patient      Patient will benefit from skilled therapeutic intervention in order to improve the following deficits and impairments:  Decreased knowledge of precautions, Pain, Decreased range of motion, Decreased knowledge of use of DME  Visit Diagnosis: Aftercare following surgery for neoplasm - Plan: PT plan of care cert/re-cert  Stiffness of right shoulder, not elsewhere classified - Plan: PT plan of care cert/re-cert  Pain in right shoulder - Plan: PT plan of care cert/re-cert     Problem List Patient Active Problem List   Diagnosis Date Noted  . Genetic testing 06/04/2016  . Breast cancer of upper-outer quadrant of right female breast ([place]) 05/15/2016  . Family history of colon cancer 03/10/2012  . CIN I (cervical intraepithelial neoplasia I)     Dominique Murphy 07/21/2016, 2:57 PM  [place], [place], [address] Phone: [phone]   Fax:  [phone]  Name: Dominique Murphy MRN: [id number] Date of Birth: 08-17-1971  Serafina Royals, PT 07/21/16 2:58 PM

## 2016-07-29 ENCOUNTER — Ambulatory Visit
Admission: RE | Admit: 2016-07-29 | Discharge: 2016-07-29 | Disposition: A | Discharge status: Home / Self Care | Payer: BLUE CROSS/BLUE SHIELD | Source: Ambulatory Visit | Attending: Radiation Oncology | Admitting: Radiation Oncology

## 2016-07-29 DIAGNOSIS — Z510 Encounter for antineoplastic radiation therapy: Secondary | ICD-10-CM | POA: Diagnosis not present

## 2016-07-29 DIAGNOSIS — C50411 Malignant neoplasm of upper-outer quadrant of right female breast: Secondary | ICD-10-CM

## 2016-07-31 DIAGNOSIS — Z510 Encounter for antineoplastic radiation therapy: Secondary | ICD-10-CM | POA: Diagnosis not present

## 2016-07-31 NOTE — Progress Notes (Signed)
  Radiation Oncology         [phone]) [phone] ________________________________  Name: Dominique Murphy MRN: [id number]  Date: 07/29/2016  DOB: [birth year]  Optical Surface Tracking Plan:  Since intensity modulated radiotherapy (IMRT) and 3D conformal radiation treatment methods are predicated on accurate and precise positioning for treatment, intrafraction motion monitoring is medically necessary to ensure accurate and safe treatment delivery.  The ability to quantify intrafraction motion without excessive ionizing radiation dose can only be performed with optical surface tracking. Accordingly, surface imaging offers the opportunity to obtain 3D measurements of patient position throughout IMRT and 3D treatments without excessive radiation exposure.  I am ordering optical surface tracking for this patient's upcoming course of radiotherapy. ________________________________  Kyung Rudd, MD 07/31/2016 1:55 PM    Reference:   Ursula Alert, J, et al. Surface imaging-based analysis of intrafraction motion for breast radiotherapy patients.Journal of [place], n. 6, nov. 2014. ISSN [id number].   Available at: <http://www.jacmp.org/index.php/jacmp/article/view/4957>.

## 2016-07-31 NOTE — Progress Notes (Signed)
  Radiation Oncology         [phone] ________________________________  Name: Dominique Murphy MRN: [id number]  Date: 07/29/2016  DOB: 1971/08/28  DIAGNOSIS:     ICD-9-CM ICD-10-CM   1. Breast cancer of upper-outer quadrant of right female breast ([place]) 174.4 C50.411      SIMULATION AND TREATMENT PLANNING NOTE  The patient presented for simulation prior to beginning her course of radiation treatment for her diagnosis of Right-sided breast cancer. The patient was placed in a supine position on a breast board. A customized vac-lock bag was constructed and this complex treatment device will be used on a daily basis during her treatment. In this fashion, a CT scan was obtained through the chest area and an isocenter was placed near the chest wall within the breast.  The patient will be planned to receive a course of radiation initially to a dose of 50.4 Gy. This will consist of a whole breast radiotherapy technique. To accomplish this, 2 customized blocks have been designed which will correspond to medial and lateral whole breast tangent fields. This treatment will be accomplished at 1.8 Gy per fraction. A forward planning technique will also be evaluated to determine if this approach improves the plan. It is anticipated that the patient will then receive a 10 Gy boost to the seroma cavity which has been contoured. This will be accomplished at 2 Gy per fraction.   This initial treatment will consist of a 3-D conformal technique. The seroma has been contoured as the primary target structure. Additionally, dose volume histograms of both this target as well as the lungs and heart will also be evaluated. Such an approach is necessary to ensure that the target area is adequately covered while the nearby critical  normal structures are adequately spared.  Plan:  The final anticipated total dose therefore will correspond to 60.4 Gy.    _______________________________   Jodelle Gross, MD, PhD

## 2016-08-05 ENCOUNTER — Ambulatory Visit
Admission: RE | Admit: 2016-08-05 | Discharge: 2016-08-05 | Disposition: A | Discharge status: Home / Self Care | Payer: BLUE CROSS/BLUE SHIELD | Source: Ambulatory Visit | Attending: Radiation Oncology | Admitting: Radiation Oncology

## 2016-08-05 DIAGNOSIS — Z510 Encounter for antineoplastic radiation therapy: Secondary | ICD-10-CM | POA: Diagnosis not present

## 2016-08-05 DIAGNOSIS — C50411 Malignant neoplasm of upper-outer quadrant of right female breast: Secondary | ICD-10-CM | POA: Insufficient documentation

## 2016-08-05 MED ORDER — ALRA NON-METALLIC DEODORANT (RAD-ONC)
1.0000 "application " | Freq: Once | TOPICAL | Status: AC
  Administered 2016-08-05: 1 via TOPICAL

## 2016-08-05 MED ORDER — RADIAPLEXRX EX GEL
Freq: Once | CUTANEOUS | Status: AC
  Administered 2016-08-05: 15:00:00 via TOPICAL

## 2016-08-05 NOTE — Progress Notes (Signed)
Pt here for patient teaching.  Pt given Radiation and You booklet, skin care instructions, Alra deodorant and Radiaplex gel. Pt reports they have watched the Radiation Therapy Education video on July 28, 2016  Reviewed areas of pertinence such as fatigue, hair loss, skin changes, breast tenderness and breast swelling . Pt able to give teach back of to pat skin, use unscented/gentle soap and drink plenty of water,apply Radiaplex bid, avoid applying anything to skin within 4 hours of treatment, avoid wearing an under wire bra and to use an electric razor if they must shave. Pt demonstrated understanding of information given and will contact nursing with any questions or concerns.     Http://rtanswers.org/treatmentinformation/whattoexpect/index

## 2016-08-06 ENCOUNTER — Ambulatory Visit
Admission: RE | Admit: 2016-08-06 | Discharge: 2016-08-06 | Disposition: A | Discharge status: Home / Self Care | Payer: BLUE CROSS/BLUE SHIELD | Source: Ambulatory Visit | Attending: Radiation Oncology | Admitting: Radiation Oncology

## 2016-08-06 DIAGNOSIS — Z510 Encounter for antineoplastic radiation therapy: Secondary | ICD-10-CM | POA: Diagnosis not present

## 2016-08-07 ENCOUNTER — Ambulatory Visit
Admission: RE | Admit: 2016-08-07 | Discharge: 2016-08-07 | Disposition: A | Discharge status: Home / Self Care | Payer: BLUE CROSS/BLUE SHIELD | Source: Ambulatory Visit | Attending: Radiation Oncology | Admitting: Radiation Oncology

## 2016-08-07 ENCOUNTER — Encounter: Payer: Self-pay | Admitting: Radiation Oncology

## 2016-08-07 VITALS — BP 124/85 | HR 82 | Resp 16 | Wt 166.1 lb

## 2016-08-07 DIAGNOSIS — Z510 Encounter for antineoplastic radiation therapy: Secondary | ICD-10-CM | POA: Diagnosis not present

## 2016-08-07 DIAGNOSIS — C50411 Malignant neoplasm of upper-outer quadrant of right female breast: Secondary | ICD-10-CM

## 2016-08-07 NOTE — Progress Notes (Signed)
Weight and vitals stable. Denies pain. Denies skin changes within treatment field. Reports using radiaplex and alra. Denies fatigue. Patient questions discoloration of surgical scar and if lumps near axilla will dissolve.   BP 124/85 (BP Location: Left Arm, Patient Position: Sitting, Cuff Size: Normal)   Pulse 82   Resp 16   Wt 166 lb 1.6 oz (75.3 kg)   SpO2 100%   BMI 26.01 kg/m  Wt Readings from Last 3 Encounters:  08/07/16 166 lb 1.6 oz (75.3 kg)  07/15/16 167 lb 9.6 oz (76 kg)  07/09/16 167 lb 6.4 oz (75.9 kg)

## 2016-08-08 NOTE — Progress Notes (Signed)
Department of Radiation Oncology  Phone:  [phone] Fax:        [phone]  Weekly Treatment Note    Name: Dominique Murphy Date: 08/08/2016 MRN: [id number] DOB: [birth year]   Diagnosis:     ICD-9-CM ICD-10-CM   1. Breast cancer of upper-outer quadrant of right female breast ([place]) 174.4 C50.411      Current dose: 3.6 Gy  Current fraction: 2   MEDICATIONS: Current Outpatient Prescriptions  Medication Sig Dispense Refill  . ALPRAZolam (XANAX) 0.5 MG tablet Take 1 tablet (0.5 mg total) by mouth at bedtime as needed for anxiety. 30 tablet 2  . diphenhydrAMINE (BENADRYL) 25 MG tablet Take 25 mg by mouth at bedtime as needed.    . hyaluronate sodium (RADIAPLEXRX) GEL Apply 1 application topically 2 (two) times daily.    . Multiple Vitamin (MULTIVITAMIN) tablet Take 1 tablet by mouth daily.    . non-metallic deodorant Jethro Poling) MISC Apply 1 application topically daily as needed.    . promethazine (PHENERGAN) 12.5 MG tablet Take 1 tablet (12.5 mg total) by mouth every 6 (six) hours as needed for nausea or vomiting. (Patient not taking: Reported on 08/07/2016) 10 tablet 0  . tamoxifen (NOLVADEX) 20 MG tablet TAKE 1 TABLET (20 MG TOTAL) BY MOUTH DAILY. (Patient not taking: Reported on 08/07/2016) 30 tablet 0  . traMADol (ULTRAM) 50 MG tablet Take 1-2 tablets (50-100 mg total) by mouth every 6 (six) hours as needed. (Patient not taking: Reported on 08/07/2016) 30 tablet 0  . zolpidem (AMBIEN) 10 MG tablet Take 1 tablet (10 mg total) by mouth at bedtime as needed for sleep. 30 tablet 2   No current facility-administered medications for this encounter.      ALLERGIES: Morphine and related and Percocet [oxycodone-acetaminophen]   LABORATORY DATA:  Lab Results  Component Value Date   WBC 6.3 01/26/2013   HGB 12.6 01/26/2013   HCT 37.6 01/26/2013   MCV 90.8 01/26/2013   PLT 301 01/26/2013   Lab Results  Component Value Date   NA 138 01/31/2014   K 4.4 01/31/2014   CL 103  01/31/2014   CO2 27 01/31/2014   Lab Results  Component Value Date   ALT 19 01/31/2014   AST 26 01/31/2014   ALKPHOS 66 01/31/2014   BILITOT 0.3 01/31/2014     NARRATIVE: Dominique Murphy was seen today for weekly treatment management. The chart was checked and the patient's films were reviewed.  Weight and vitals stable. Denies pain. Denies skin changes within treatment field. Reports using radiaplex and alra. Denies fatigue. Patient questions discoloration of surgical scar and if lumps near axilla will dissolve.   BP 124/85 (BP Location: Left Arm, Patient Position: Sitting, Cuff Size: Normal)   Pulse 82   Resp 16   Wt 166 lb 1.6 oz (75.3 kg)   SpO2 100%   BMI 26.01 kg/m  Wt Readings from Last 3 Encounters:  08/07/16 166 lb 1.6 oz (75.3 kg)  07/15/16 167 lb 9.6 oz (76 kg)  07/09/16 167 lb 6.4 oz (75.9 kg)     PHYSICAL EXAMINATION: weight is 166 lb 1.6 oz (75.3 kg). Her blood pressure is 124/85 and her pulse is 82. Her respiration is 16 and oxygen saturation is 100%.    No significant erythema or warmth in the breast area. No sign of infection. The patient does have an area of firmness which on examination in the lateral aspect of the right breast appear consistent with a seroma. Possible  scar changes well. I believe this is something that we can continue to follow during treatment.    ASSESSMENT: The patient is doing satisfactorily with treatment.  PLAN: We will continue with the patient's radiation treatment as planned.

## 2016-08-10 ENCOUNTER — Ambulatory Visit
Admission: RE | Admit: 2016-08-10 | Discharge: 2016-08-10 | Disposition: A | Discharge status: Home / Self Care | Payer: BLUE CROSS/BLUE SHIELD | Source: Ambulatory Visit | Attending: Radiation Oncology | Admitting: Radiation Oncology

## 2016-08-10 ENCOUNTER — Ambulatory Visit: Payer: BLUE CROSS/BLUE SHIELD

## 2016-08-10 DIAGNOSIS — Z510 Encounter for antineoplastic radiation therapy: Secondary | ICD-10-CM | POA: Diagnosis not present

## 2016-08-11 ENCOUNTER — Ambulatory Visit
Admission: RE | Admit: 2016-08-11 | Discharge: 2016-08-11 | Disposition: A | Discharge status: Home / Self Care | Payer: BLUE CROSS/BLUE SHIELD | Source: Ambulatory Visit | Attending: Radiation Oncology | Admitting: Radiation Oncology

## 2016-08-11 ENCOUNTER — Ambulatory Visit: Payer: BLUE CROSS/BLUE SHIELD

## 2016-08-11 DIAGNOSIS — Z510 Encounter for antineoplastic radiation therapy: Secondary | ICD-10-CM | POA: Diagnosis not present

## 2016-08-12 ENCOUNTER — Ambulatory Visit: Payer: BLUE CROSS/BLUE SHIELD

## 2016-08-12 ENCOUNTER — Ambulatory Visit
Admission: RE | Admit: 2016-08-12 | Discharge: 2016-08-12 | Disposition: A | Discharge status: Home / Self Care | Payer: BLUE CROSS/BLUE SHIELD | Source: Ambulatory Visit | Attending: Radiation Oncology | Admitting: Radiation Oncology

## 2016-08-12 DIAGNOSIS — Z510 Encounter for antineoplastic radiation therapy: Secondary | ICD-10-CM | POA: Diagnosis not present

## 2016-08-13 ENCOUNTER — Ambulatory Visit
Admission: RE | Admit: 2016-08-13 | Discharge: 2016-08-13 | Disposition: A | Discharge status: Home / Self Care | Payer: BLUE CROSS/BLUE SHIELD | Source: Ambulatory Visit | Attending: Radiation Oncology | Admitting: Radiation Oncology

## 2016-08-13 DIAGNOSIS — Z79899 Other long term (current) drug therapy: Secondary | ICD-10-CM | POA: Insufficient documentation

## 2016-08-13 DIAGNOSIS — C50411 Malignant neoplasm of upper-outer quadrant of right female breast: Secondary | ICD-10-CM | POA: Diagnosis not present

## 2016-08-13 DIAGNOSIS — Z170 Estrogen receptor positive status [ER+]: Secondary | ICD-10-CM | POA: Insufficient documentation

## 2016-08-13 DIAGNOSIS — Z885 Allergy status to narcotic agent status: Secondary | ICD-10-CM | POA: Insufficient documentation

## 2016-08-13 DIAGNOSIS — Z79810 Long term (current) use of selective estrogen receptor modulators (SERMs): Secondary | ICD-10-CM | POA: Diagnosis not present

## 2016-08-13 DIAGNOSIS — Z510 Encounter for antineoplastic radiation therapy: Secondary | ICD-10-CM | POA: Insufficient documentation

## 2016-08-14 ENCOUNTER — Ambulatory Visit
Admission: RE | Admit: 2016-08-14 | Discharge: 2016-08-14 | Disposition: A | Discharge status: Home / Self Care | Payer: BLUE CROSS/BLUE SHIELD | Source: Ambulatory Visit | Attending: Radiation Oncology | Admitting: Radiation Oncology

## 2016-08-14 ENCOUNTER — Encounter: Payer: Self-pay | Admitting: Radiation Oncology

## 2016-08-14 VITALS — BP 123/88 | HR 75 | Temp 97.6°F | Resp 16 | Wt 164.8 lb

## 2016-08-14 DIAGNOSIS — C50411 Malignant neoplasm of upper-outer quadrant of right female breast: Secondary | ICD-10-CM

## 2016-08-14 DIAGNOSIS — Z510 Encounter for antineoplastic radiation therapy: Secondary | ICD-10-CM | POA: Diagnosis not present

## 2016-08-14 NOTE — Progress Notes (Signed)
Weekly rad txs right breast 7 completed, dryness around nipple, no other skin changes,uising radiaiplex bid, no c/o pain, appetite good, no fatigue stated,  11:01 AM BP 123/88 (BP Location: Left Arm, Patient Position: Sitting, Cuff Size: Normal)   Pulse 75   Temp 97.6 F (36.4 C) (Oral)   Resp 16   Wt 164 lb 12.8 oz (74.8 kg)   BMI 25.81 kg/m   Wt Readings from Last 3 Encounters:  08/14/16 164 lb 12.8 oz (74.8 kg)  08/07/16 166 lb 1.6 oz (75.3 kg)  07/15/16 167 lb 9.6 oz (76 kg)

## 2016-08-14 NOTE — Progress Notes (Signed)
   Department of Radiation Oncology  Phone:  [phone] Fax:        [phone]  Weekly Treatment Note    Name: Dominique Murphy Date: 08/14/2016 MRN: [id number] DOB: [birth year]   Diagnosis:     ICD-9-CM ICD-10-CM   1. Breast cancer of upper-outer quadrant of right female breast ([place]) 174.4 C50.411      Current dose: 12.6 Gy  Current fraction: 7   MEDICATIONS: Current Outpatient Prescriptions  Medication Sig Dispense Refill  . ALPRAZolam (XANAX) 0.5 MG tablet Take 1 tablet (0.5 mg total) by mouth at bedtime as needed for anxiety. 30 tablet 2  . diphenhydrAMINE (BENADRYL) 25 MG tablet Take 25 mg by mouth at bedtime as needed.    . hyaluronate sodium (RADIAPLEXRX) GEL Apply 1 application topically 2 (two) times daily.    . Multiple Vitamin (MULTIVITAMIN) tablet Take 1 tablet by mouth daily.    . non-metallic deodorant Jethro Poling) MISC Apply 1 application topically daily as needed.    . promethazine (PHENERGAN) 12.5 MG tablet Take 1 tablet (12.5 mg total) by mouth every 6 (six) hours as needed for nausea or vomiting. (Patient not taking: Reported on 08/14/2016) 10 tablet 0  . tamoxifen (NOLVADEX) 20 MG tablet TAKE 1 TABLET (20 MG TOTAL) BY MOUTH DAILY. (Patient not taking: Reported on 08/14/2016) 30 tablet 0  . traMADol (ULTRAM) 50 MG tablet Take 1-2 tablets (50-100 mg total) by mouth every 6 (six) hours as needed. (Patient not taking: Reported on 08/14/2016) 30 tablet 0  . zolpidem (AMBIEN) 10 MG tablet Take 1 tablet (10 mg total) by mouth at bedtime as needed for sleep. 30 tablet 2   No current facility-administered medications for this encounter.      ALLERGIES: Morphine and related and Percocet [oxycodone-acetaminophen]   LABORATORY DATA:  Lab Results  Component Value Date   WBC 6.3 01/26/2013   HGB 12.6 01/26/2013   HCT 37.6 01/26/2013   MCV 90.8 01/26/2013   PLT 301 01/26/2013   Lab Results  Component Value Date   NA 138 01/31/2014   K 4.4 01/31/2014   CL 103  01/31/2014   CO2 27 01/31/2014   Lab Results  Component Value Date   ALT 19 01/31/2014   AST 26 01/31/2014   ALKPHOS 66 01/31/2014   BILITOT 0.3 01/31/2014     NARRATIVE: [name] was seen today for weekly treatment management. The chart was checked and the patient's films were reviewed.  Weekly rad txs right breast 7 completed, dryness around nipple, no other skin changes,uising radiaiplex bid, no c/o pain, appetite good, no fatigue stated,  11:10 AM BP 123/88 (BP Location: Left Arm, Patient Position: Sitting, Cuff Size: Normal)   Pulse 75   Temp 97.6 F (36.4 C) (Oral)   Resp 16   Wt 164 lb 12.8 oz (74.8 kg)   BMI 25.81 kg/m   Wt Readings from Last 3 Encounters:  08/14/16 164 lb 12.8 oz (74.8 kg)  08/07/16 166 lb 1.6 oz (75.3 kg)  07/15/16 167 lb 9.6 oz (76 kg)    PHYSICAL EXAMINATION: weight is 164 lb 12.8 oz (74.8 kg). Her oral temperature is 97.6 F (36.4 C). Her blood pressure is 123/88 and her pulse is 75. Her respiration is 16.        ASSESSMENT: The patient is doing satisfactorily with treatment.  PLAN: We will continue with the patient's radiation treatment as planned.

## 2016-08-17 ENCOUNTER — Ambulatory Visit
Admission: RE | Admit: 2016-08-17 | Discharge: 2016-08-17 | Disposition: A | Discharge status: Home / Self Care | Payer: BLUE CROSS/BLUE SHIELD | Source: Ambulatory Visit | Attending: Radiation Oncology | Admitting: Radiation Oncology

## 2016-08-17 DIAGNOSIS — Z510 Encounter for antineoplastic radiation therapy: Secondary | ICD-10-CM | POA: Diagnosis not present

## 2016-08-18 ENCOUNTER — Ambulatory Visit
Admission: RE | Admit: 2016-08-18 | Discharge: 2016-08-18 | Disposition: A | Discharge status: Home / Self Care | Payer: BLUE CROSS/BLUE SHIELD | Source: Ambulatory Visit | Attending: Radiation Oncology | Admitting: Radiation Oncology

## 2016-08-18 DIAGNOSIS — Z510 Encounter for antineoplastic radiation therapy: Secondary | ICD-10-CM | POA: Diagnosis not present

## 2016-08-19 ENCOUNTER — Ambulatory Visit
Admission: RE | Admit: 2016-08-19 | Discharge: 2016-08-19 | Disposition: A | Discharge status: Home / Self Care | Payer: BLUE CROSS/BLUE SHIELD | Source: Ambulatory Visit | Attending: Radiation Oncology | Admitting: Radiation Oncology

## 2016-08-19 DIAGNOSIS — Z510 Encounter for antineoplastic radiation therapy: Secondary | ICD-10-CM | POA: Diagnosis not present

## 2016-08-20 ENCOUNTER — Inpatient Hospital Stay: Admission: RE | Admit: 2016-08-20 | Payer: Self-pay | Source: Ambulatory Visit | Admitting: Radiation Oncology

## 2016-08-20 ENCOUNTER — Ambulatory Visit
Admission: RE | Admit: 2016-08-20 | Discharge: 2016-08-20 | Disposition: A | Discharge status: Home / Self Care | Payer: BLUE CROSS/BLUE SHIELD | Source: Ambulatory Visit | Attending: Radiation Oncology | Admitting: Radiation Oncology

## 2016-08-20 DIAGNOSIS — Z510 Encounter for antineoplastic radiation therapy: Secondary | ICD-10-CM | POA: Diagnosis not present

## 2016-08-21 ENCOUNTER — Ambulatory Visit: Payer: BLUE CROSS/BLUE SHIELD | Admitting: Radiation Oncology

## 2016-08-21 ENCOUNTER — Ambulatory Visit
Admission: RE | Admit: 2016-08-21 | Discharge: 2016-08-21 | Disposition: A | Discharge status: Home / Self Care | Payer: BLUE CROSS/BLUE SHIELD | Source: Ambulatory Visit | Attending: Radiation Oncology | Admitting: Radiation Oncology

## 2016-08-21 DIAGNOSIS — Z510 Encounter for antineoplastic radiation therapy: Secondary | ICD-10-CM | POA: Diagnosis not present

## 2016-08-24 ENCOUNTER — Ambulatory Visit
Admission: RE | Admit: 2016-08-24 | Discharge: 2016-08-24 | Disposition: A | Discharge status: Home / Self Care | Payer: BLUE CROSS/BLUE SHIELD | Source: Ambulatory Visit | Attending: Radiation Oncology | Admitting: Radiation Oncology

## 2016-08-24 DIAGNOSIS — Z510 Encounter for antineoplastic radiation therapy: Secondary | ICD-10-CM | POA: Diagnosis not present

## 2016-08-25 ENCOUNTER — Ambulatory Visit
Admission: RE | Admit: 2016-08-25 | Discharge: 2016-08-25 | Disposition: A | Discharge status: Home / Self Care | Payer: BLUE CROSS/BLUE SHIELD | Source: Ambulatory Visit | Attending: Radiation Oncology | Admitting: Radiation Oncology

## 2016-08-25 DIAGNOSIS — Z510 Encounter for antineoplastic radiation therapy: Secondary | ICD-10-CM | POA: Diagnosis not present

## 2016-08-26 ENCOUNTER — Ambulatory Visit
Admission: RE | Admit: 2016-08-26 | Discharge: 2016-08-26 | Disposition: A | Discharge status: Home / Self Care | Payer: BLUE CROSS/BLUE SHIELD | Source: Ambulatory Visit | Attending: Radiation Oncology | Admitting: Radiation Oncology

## 2016-08-26 DIAGNOSIS — Z510 Encounter for antineoplastic radiation therapy: Secondary | ICD-10-CM | POA: Diagnosis not present

## 2016-08-27 ENCOUNTER — Encounter: Payer: Self-pay | Admitting: Radiation Oncology

## 2016-08-27 ENCOUNTER — Ambulatory Visit
Admission: RE | Admit: 2016-08-27 | Discharge: 2016-08-27 | Disposition: A | Discharge status: Home / Self Care | Payer: BLUE CROSS/BLUE SHIELD | Source: Ambulatory Visit | Attending: Radiation Oncology | Admitting: Radiation Oncology

## 2016-08-27 VITALS — BP 119/92 | HR 104 | Temp 97.7°F | Resp 16 | Wt 161.8 lb

## 2016-08-27 DIAGNOSIS — Z170 Estrogen receptor positive status [ER+]: Secondary | ICD-10-CM

## 2016-08-27 DIAGNOSIS — C50411 Malignant neoplasm of upper-outer quadrant of right female breast: Secondary | ICD-10-CM

## 2016-08-27 DIAGNOSIS — Z510 Encounter for antineoplastic radiation therapy: Secondary | ICD-10-CM | POA: Diagnosis not present

## 2016-08-27 NOTE — Progress Notes (Signed)
Weekly rad txs right breast,   15/33 mild erythema, skin intact,  Using radiaoplex bid, appetite good, no twinges , but has a small hardened areas side right breast, no pain 9:53 AM BP (!) 119/92 (BP Location: Left Arm, Patient Position: Sitting, Cuff Size: Normal)   Pulse (!) 104   Temp 97.7 F (36.5 C) (Oral)   Resp 16   Wt 161 lb 12.8 oz (73.4 kg)   BMI 25.34 kg/m   Wt Readings from Last 3 Encounters:  08/27/16 161 lb 12.8 oz (73.4 kg)  08/14/16 164 lb 12.8 oz (74.8 kg)  08/07/16 166 lb 1.6 oz (75.3 kg)

## 2016-08-27 NOTE — Progress Notes (Signed)
Department of Radiation Oncology  Phone:  [phone] Fax:        [phone]  Weekly Treatment Note    Name: Dominique Murphy Date: 08/27/2016 MRN: [id number] DOB: [birth year]   Diagnosis:     ICD-9-CM ICD-10-CM   1. Malignant neoplasm of upper-outer quadrant of right breast in female, estrogen receptor positive ([place]) 174.4 C50.411    V86.0 Z17.0      Current dose: 28.8 Gy  Current fraction: 16   MEDICATIONS: Current Outpatient Prescriptions  Medication Sig Dispense Refill  . ALPRAZolam (XANAX) 0.5 MG tablet Take 1 tablet (0.5 mg total) by mouth at bedtime as needed for anxiety. 30 tablet 2  . diphenhydrAMINE (BENADRYL) 25 MG tablet Take 25 mg by mouth at bedtime as needed.    . hyaluronate sodium (RADIAPLEXRX) GEL Apply 1 application topically 2 (two) times daily.    . Multiple Vitamin (MULTIVITAMIN) tablet Take 1 tablet by mouth daily.    . non-metallic deodorant Jethro Poling) MISC Apply 1 application topically daily as needed.    . promethazine (PHENERGAN) 12.5 MG tablet Take 1 tablet (12.5 mg total) by mouth every 6 (six) hours as needed for nausea or vomiting. (Patient not taking: Reported on 08/14/2016) 10 tablet 0  . tamoxifen (NOLVADEX) 20 MG tablet TAKE 1 TABLET (20 MG TOTAL) BY MOUTH DAILY. (Patient not taking: Reported on 08/14/2016) 30 tablet 0  . traMADol (ULTRAM) 50 MG tablet Take 1-2 tablets (50-100 mg total) by mouth every 6 (six) hours as needed. (Patient not taking: Reported on 08/14/2016) 30 tablet 0  . zolpidem (AMBIEN) 10 MG tablet Take 1 tablet (10 mg total) by mouth at bedtime as needed for sleep. 30 tablet 2   No current facility-administered medications for this encounter.      ALLERGIES: Morphine and related and Percocet [oxycodone-acetaminophen]   LABORATORY DATA:  Lab Results  Component Value Date   WBC 6.3 01/26/2013   HGB 12.6 01/26/2013   HCT 37.6 01/26/2013   MCV 90.8 01/26/2013   PLT 301 01/26/2013   Lab Results  Component Value Date   NA 138 01/31/2014   K 4.4 01/31/2014   CL 103 01/31/2014   CO2 27 01/31/2014   Lab Results  Component Value Date   ALT 19 01/31/2014   AST 26 01/31/2014   ALKPHOS 66 01/31/2014   BILITOT 0.3 01/31/2014     NARRATIVE: Dominique Murphy was seen today for weekly treatment management. The chart was checked and the patient's films were reviewed.  Weekly rad txs right breast,   15/33 mild erythema, skin intact,  Using radiaoplex bid, appetite good, no twinges , but has a small hardened areas side right breast, no pain 10:18 AM BP (!) 119/92 (BP Location: Left Arm, Patient Position: Sitting, Cuff Size: Normal)   Pulse (!) 104   Temp 97.7 F (36.5 C) (Oral)   Resp 16   Wt 161 lb 12.8 oz (73.4 kg)   BMI 25.34 kg/m   Wt Readings from Last 3 Encounters:  08/27/16 161 lb 12.8 oz (73.4 kg)  08/14/16 164 lb 12.8 oz (74.8 kg)  08/07/16 166 lb 1.6 oz (75.3 kg)    PHYSICAL EXAMINATION: weight is 161 lb 12.8 oz (73.4 kg). Her oral temperature is 97.7 F (36.5 C). Her blood pressure is 119/92 (abnormal) and her pulse is 104 (abnormal). Her respiration is 16.        ASSESSMENT: The patient is doing satisfactorily with treatment.  PLAN: We will continue with the  patient's radiation treatment as planned.

## 2016-08-28 ENCOUNTER — Ambulatory Visit
Admission: RE | Admit: 2016-08-28 | Discharge: 2016-08-28 | Disposition: A | Discharge status: Home / Self Care | Payer: BLUE CROSS/BLUE SHIELD | Source: Ambulatory Visit | Attending: Radiation Oncology | Admitting: Radiation Oncology

## 2016-08-28 DIAGNOSIS — Z510 Encounter for antineoplastic radiation therapy: Secondary | ICD-10-CM | POA: Diagnosis not present

## 2016-08-31 ENCOUNTER — Encounter: Payer: Self-pay | Admitting: Radiation Oncology

## 2016-08-31 ENCOUNTER — Ambulatory Visit
Admission: RE | Admit: 2016-08-31 | Discharge: 2016-08-31 | Disposition: A | Discharge status: Home / Self Care | Payer: BLUE CROSS/BLUE SHIELD | Source: Ambulatory Visit | Attending: Radiation Oncology | Admitting: Radiation Oncology

## 2016-08-31 DIAGNOSIS — Z510 Encounter for antineoplastic radiation therapy: Secondary | ICD-10-CM | POA: Diagnosis not present

## 2016-09-01 ENCOUNTER — Ambulatory Visit
Admission: RE | Admit: 2016-09-01 | Discharge: 2016-09-01 | Disposition: A | Discharge status: Home / Self Care | Payer: BLUE CROSS/BLUE SHIELD | Source: Ambulatory Visit | Attending: Radiation Oncology | Admitting: Radiation Oncology

## 2016-09-01 DIAGNOSIS — Z510 Encounter for antineoplastic radiation therapy: Secondary | ICD-10-CM | POA: Diagnosis not present

## 2016-09-02 ENCOUNTER — Ambulatory Visit
Admission: RE | Admit: 2016-09-02 | Discharge: 2016-09-02 | Disposition: A | Discharge status: Home / Self Care | Payer: BLUE CROSS/BLUE SHIELD | Source: Ambulatory Visit | Attending: Radiation Oncology | Admitting: Radiation Oncology

## 2016-09-02 ENCOUNTER — Encounter: Payer: Self-pay | Admitting: Radiation Oncology

## 2016-09-02 VITALS — BP 115/78 | HR 90 | Temp 97.7°F | Resp 16 | Wt 161.0 lb

## 2016-09-02 DIAGNOSIS — Z79899 Other long term (current) drug therapy: Secondary | ICD-10-CM | POA: Diagnosis not present

## 2016-09-02 DIAGNOSIS — Z170 Estrogen receptor positive status [ER+]: Secondary | ICD-10-CM | POA: Diagnosis not present

## 2016-09-02 DIAGNOSIS — C50411 Malignant neoplasm of upper-outer quadrant of right female breast: Secondary | ICD-10-CM | POA: Insufficient documentation

## 2016-09-02 DIAGNOSIS — Z510 Encounter for antineoplastic radiation therapy: Secondary | ICD-10-CM | POA: Diagnosis not present

## 2016-09-02 MED ORDER — RADIAPLEXRX EX GEL
Freq: Once | CUTANEOUS | Status: AC
  Administered 2016-09-02: 10:00:00 via TOPICAL

## 2016-09-02 NOTE — Progress Notes (Signed)
Department of Radiation Oncology  Phone:  [phone] Fax:        [phone]  Weekly Treatment Note    Name: Dominique Murphy Date: 09/02/2016 MRN: [id number] DOB: 08-29-1971   Diagnosis:     ICD-9-CM ICD-10-CM   1. Malignant neoplasm of upper-outer quadrant of right breast in female, estrogen receptor positive ([facility]) 174.4 C50.411 hyaluronate sodium (RADIAPLEXRX) gel   V86.0 Z17.0      Current dose: 36 Gy  Current fraction: 20   MEDICATIONS: Current Outpatient Prescriptions  Medication Sig Dispense Refill  . ALPRAZolam (XANAX) 0.5 MG tablet Take 1 tablet (0.5 mg total) by mouth at bedtime as needed for anxiety. 30 tablet 2  . diphenhydrAMINE (BENADRYL) 25 MG tablet Take 25 mg by mouth at bedtime as needed.    . hyaluronate sodium (RADIAPLEXRX) GEL Apply 1 application topically 2 (two) times daily.    . Multiple Vitamin (MULTIVITAMIN) tablet Take 1 tablet by mouth daily.    . non-metallic deodorant Jethro Poling) MISC Apply 1 application topically daily as needed.    . tamoxifen (NOLVADEX) 20 MG tablet TAKE 1 TABLET (20 MG TOTAL) BY MOUTH DAILY. (Patient not taking: Reported on 09/02/2016) 30 tablet 0  . zolpidem (AMBIEN) 10 MG tablet Take 1 tablet (10 mg total) by mouth at bedtime as needed for sleep. 30 tablet 2   No current facility-administered medications for this encounter.      ALLERGIES: Morphine and related and Percocet [oxycodone-acetaminophen]   LABORATORY DATA:  Lab Results  Component Value Date   WBC 6.3 01/26/2013   HGB 12.6 01/26/2013   HCT 37.6 01/26/2013   MCV 90.8 01/26/2013   PLT 301 01/26/2013   Lab Results  Component Value Date   NA 138 01/31/2014   K 4.4 01/31/2014   CL 103 01/31/2014   CO2 27 01/31/2014   Lab Results  Component Value Date   ALT 19 01/31/2014   AST 26 01/31/2014   ALKPHOS 66 01/31/2014   BILITOT 0.3 01/31/2014     NARRATIVE: Dominique Murphy was seen today for weekly treatment management. The chart was checked and the  patient's films were reviewed.  Weekly rad txs right breast 20/33 completed. The patient reports mild erythema and pruritus. Used massage oils yesterday and the right breast became itchy. Denies pain or fatigue. Using radiaplex bid. Good appetite good.  10:14 AM BP 115/78 (BP Location: Left Arm, Patient Position: Sitting, Cuff Size: Normal)   Pulse 90   Temp 97.7 F (36.5 C) (Axillary)   Resp 16   Wt 161 lb (73 kg)   BMI 25.22 kg/m   Wt Readings from Last 3 Encounters:  09/02/16 161 lb (73 kg)  08/27/16 161 lb 12.8 oz (73.4 kg)  08/14/16 164 lb 12.8 oz (74.8 kg)    PHYSICAL EXAMINATION: weight is 161 lb (73 kg). Her axillary temperature is 97.7 F (36.5 C). Her blood pressure is 115/78 and her pulse is 90. Her respiration is 16.  Mild dermatitis in the treatment area without desquamation.  ASSESSMENT: The patient is doing satisfactorily with treatment.  PLAN: We will continue with the patient's radiation treatment as planned.   ------------------------------------------------  Jodelle Gross, MD, PhD  This document serves as a record of services personally performed by Kyung Rudd, MD. It was created on his behalf by Darcus Austin, a trained medical scribe. The creation of this record is based on the scribe's personal observations and the provider's statements to them. This document has been  checked and approved by the attending provider.

## 2016-09-02 NOTE — Progress Notes (Signed)
Weekly rad txs right  20/33 completed,  Breast, mild erythema and itchiness stated, used massage oils yesterday and became itchy stated,   No pain, using radiaplex bid,  Appetite good, no fatigue 9:52 AM BP 115/78 (BP Location: Left Arm, Patient Position: Sitting, Cuff Size: Normal)   Pulse 90   Temp 97.7 F (36.5 C) (Axillary)   Resp 16   Wt 161 lb (73 kg)   BMI 25.22 kg/m   Wt Readings from Last 3 Encounters:  09/02/16 161 lb (73 kg)  08/27/16 161 lb 12.8 oz (73.4 kg)  08/14/16 164 lb 12.8 oz (74.8 kg)

## 2016-09-03 ENCOUNTER — Ambulatory Visit
Admission: RE | Admit: 2016-09-03 | Discharge: 2016-09-03 | Disposition: A | Discharge status: Home / Self Care | Payer: BLUE CROSS/BLUE SHIELD | Source: Ambulatory Visit | Attending: Radiation Oncology | Admitting: Radiation Oncology

## 2016-09-03 DIAGNOSIS — Z510 Encounter for antineoplastic radiation therapy: Secondary | ICD-10-CM | POA: Diagnosis not present

## 2016-09-04 ENCOUNTER — Encounter: Payer: Self-pay | Admitting: Radiation Oncology

## 2016-09-04 ENCOUNTER — Ambulatory Visit
Admission: RE | Admit: 2016-09-04 | Discharge: 2016-09-04 | Disposition: A | Discharge status: Home / Self Care | Payer: BLUE CROSS/BLUE SHIELD | Source: Ambulatory Visit | Attending: Radiation Oncology | Admitting: Radiation Oncology

## 2016-09-04 DIAGNOSIS — Z510 Encounter for antineoplastic radiation therapy: Secondary | ICD-10-CM | POA: Diagnosis not present

## 2016-09-07 ENCOUNTER — Ambulatory Visit
Admission: RE | Admit: 2016-09-07 | Discharge: 2016-09-07 | Disposition: A | Discharge status: Home / Self Care | Payer: BLUE CROSS/BLUE SHIELD | Source: Ambulatory Visit | Attending: Radiation Oncology | Admitting: Radiation Oncology

## 2016-09-07 DIAGNOSIS — Z510 Encounter for antineoplastic radiation therapy: Secondary | ICD-10-CM | POA: Diagnosis not present

## 2016-09-08 ENCOUNTER — Ambulatory Visit
Admission: RE | Admit: 2016-09-08 | Discharge: 2016-09-08 | Disposition: A | Discharge status: Home / Self Care | Payer: BLUE CROSS/BLUE SHIELD | Source: Ambulatory Visit | Attending: Radiation Oncology | Admitting: Radiation Oncology

## 2016-09-08 DIAGNOSIS — Z510 Encounter for antineoplastic radiation therapy: Secondary | ICD-10-CM | POA: Diagnosis not present

## 2016-09-09 ENCOUNTER — Ambulatory Visit
Admission: RE | Admit: 2016-09-09 | Discharge: 2016-09-09 | Disposition: A | Discharge status: Home / Self Care | Payer: BLUE CROSS/BLUE SHIELD | Source: Ambulatory Visit | Attending: Radiation Oncology | Admitting: Radiation Oncology

## 2016-09-09 DIAGNOSIS — Z510 Encounter for antineoplastic radiation therapy: Secondary | ICD-10-CM | POA: Diagnosis not present

## 2016-09-10 ENCOUNTER — Ambulatory Visit
Admission: RE | Admit: 2016-09-10 | Discharge: 2016-09-10 | Disposition: A | Discharge status: Home / Self Care | Payer: BLUE CROSS/BLUE SHIELD | Source: Ambulatory Visit | Attending: Radiation Oncology | Admitting: Radiation Oncology

## 2016-09-10 DIAGNOSIS — Z510 Encounter for antineoplastic radiation therapy: Secondary | ICD-10-CM | POA: Diagnosis not present

## 2016-09-11 ENCOUNTER — Ambulatory Visit
Admission: RE | Admit: 2016-09-11 | Discharge: 2016-09-11 | Disposition: A | Discharge status: Home / Self Care | Payer: BLUE CROSS/BLUE SHIELD | Source: Ambulatory Visit | Attending: Radiation Oncology | Admitting: Radiation Oncology

## 2016-09-11 ENCOUNTER — Ambulatory Visit: Payer: BLUE CROSS/BLUE SHIELD | Admitting: Radiation Oncology

## 2016-09-11 ENCOUNTER — Encounter: Payer: Self-pay | Admitting: Radiation Oncology

## 2016-09-11 VITALS — BP 114/77 | HR 85 | Temp 97.5°F | Resp 18 | Ht 67.0 in | Wt 162.6 lb

## 2016-09-11 DIAGNOSIS — Z170 Estrogen receptor positive status [ER+]: Secondary | ICD-10-CM

## 2016-09-11 DIAGNOSIS — Z510 Encounter for antineoplastic radiation therapy: Secondary | ICD-10-CM | POA: Diagnosis not present

## 2016-09-11 DIAGNOSIS — C50411 Malignant neoplasm of upper-outer quadrant of right female breast: Secondary | ICD-10-CM

## 2016-09-11 NOTE — Progress Notes (Signed)
Weekly rad txs right  27/33 completed,  Breast, mild erythema with dermatitis and itchiness state.,   No pain, using radiaplex bid,  Appetite good.   Denies  Fatigue. Wt Readings from Last 3 Encounters:  09/11/16 162 lb 9.6 oz (73.8 kg)  09/02/16 161 lb (73 kg)  08/27/16 161 lb 12.8 oz (73.4 kg)  BP 114/77   Pulse 85   Temp 97.5 F (36.4 C) (Oral)   Resp 18   Ht 5\' 7"  (1.702 m)   Wt 162 lb 9.6 oz (73.8 kg)   SpO2 100%   BMI 25.47 kg/m

## 2016-09-11 NOTE — Progress Notes (Addendum)
Department of Radiation Oncology  Phone:  [phone] Fax:        [phone]  Weekly Treatment Note    Name: Dominique Murphy Date: 09/11/2016 MRN: [id number] DOB: [birth year]   Diagnosis:     ICD-9-CM ICD-10-CM   1. Malignant neoplasm of upper-outer quadrant of right breast in female, estrogen receptor positive ([place]) 174.4 C50.411    V86.0 Z17.0      Current dose: 48.6 Gy  Current fraction: 27   MEDICATIONS: Current Outpatient Prescriptions  Medication Sig Dispense Refill  . hyaluronate sodium (RADIAPLEXRX) GEL Apply 1 application topically 2 (two) times daily.    Marland Kitchen ALPRAZolam (XANAX) 0.5 MG tablet Take 1 tablet (0.5 mg total) by mouth at bedtime as needed for anxiety. (Patient not taking: Reported on 09/11/2016) 30 tablet 2  . diphenhydrAMINE (BENADRYL) 25 MG tablet Take 25 mg by mouth at bedtime as needed.    . Multiple Vitamin (MULTIVITAMIN) tablet Take 1 tablet by mouth daily.    . non-metallic deodorant Jethro Poling) MISC Apply 1 application topically daily as needed.    . tamoxifen (NOLVADEX) 20 MG tablet TAKE 1 TABLET (20 MG TOTAL) BY MOUTH DAILY. (Patient not taking: Reported on 09/11/2016) 30 tablet 0  . zolpidem (AMBIEN) 10 MG tablet Take 1 tablet (10 mg total) by mouth at bedtime as needed for sleep. 30 tablet 2   No current facility-administered medications for this encounter.      ALLERGIES: Morphine and related and Percocet [oxycodone-acetaminophen]   LABORATORY DATA:  Lab Results  Component Value Date   WBC 6.3 01/26/2013   HGB 12.6 01/26/2013   HCT 37.6 01/26/2013   MCV 90.8 01/26/2013   PLT 301 01/26/2013   Lab Results  Component Value Date   NA 138 01/31/2014   K 4.4 01/31/2014   CL 103 01/31/2014   CO2 27 01/31/2014   Lab Results  Component Value Date   ALT 19 01/31/2014   AST 26 01/31/2014   ALKPHOS 66 01/31/2014   BILITOT 0.3 01/31/2014     NARRATIVE: Dominique Murphy was seen today for weekly treatment management. The chart was  checked and the patient's films were reviewed.  Weekly rad txs right 27/33 completed. Mild erythema with dermatitis and itchiness.No pain, using radiaplex bid and hydrocortisone, good appetite, denies fatigue.  11:08 AM BP 114/77   Pulse 85   Temp 97.5 F (36.4 C) (Oral)   Resp 18   Ht 5\' 7"  (1.702 m)   Wt 162 lb 9.6 oz (73.8 kg)   SpO2 100%   BMI 25.47 kg/m   Wt Readings from Last 3 Encounters:  09/11/16 162 lb 9.6 oz (73.8 kg)  09/02/16 161 lb (73 kg)  08/27/16 161 lb 12.8 oz (73.4 kg)    PHYSICAL EXAMINATION: height is 5\' 7"  (1.702 m) and weight is 162 lb 9.6 oz (73.8 kg). Her oral temperature is 97.5 F (36.4 C). Her blood pressure is 114/77 and her pulse is 85. Her respiration is 18 and oxygen saturation is 100%.   Moderate dermatitis in the treatment area.  ASSESSMENT: The patient is doing satisfactorily with treatment.  PLAN: We will continue with the patient's radiation treatment as planned. I wrote her a prescription for a lymphedema compression sleeve.  ------------------------------------------------  Jodelle Gross, MD, PhD  This document serves as a record of services personally performed by Kyung Rudd, MD. It was created on his behalf by Darcus Austin, a trained medical scribe. The creation of this record is  based on the scribe's personal observations and the provider's statements to them. This document has been checked and approved by the attending provider.

## 2016-09-14 ENCOUNTER — Ambulatory Visit
Admission: RE | Admit: 2016-09-14 | Discharge: 2016-09-14 | Disposition: A | Discharge status: Home / Self Care | Payer: BLUE CROSS/BLUE SHIELD | Source: Ambulatory Visit | Attending: Radiation Oncology | Admitting: Radiation Oncology

## 2016-09-14 DIAGNOSIS — Z510 Encounter for antineoplastic radiation therapy: Secondary | ICD-10-CM | POA: Diagnosis not present

## 2016-09-15 ENCOUNTER — Ambulatory Visit
Admission: RE | Admit: 2016-09-15 | Discharge: 2016-09-15 | Disposition: A | Discharge status: Home / Self Care | Payer: BLUE CROSS/BLUE SHIELD | Source: Ambulatory Visit | Attending: Radiation Oncology | Admitting: Radiation Oncology

## 2016-09-15 DIAGNOSIS — Z510 Encounter for antineoplastic radiation therapy: Secondary | ICD-10-CM | POA: Diagnosis not present

## 2016-09-16 ENCOUNTER — Ambulatory Visit
Admission: RE | Admit: 2016-09-16 | Discharge: 2016-09-16 | Disposition: A | Discharge status: Home / Self Care | Payer: BLUE CROSS/BLUE SHIELD | Source: Ambulatory Visit | Attending: Radiation Oncology | Admitting: Radiation Oncology

## 2016-09-16 DIAGNOSIS — Z510 Encounter for antineoplastic radiation therapy: Secondary | ICD-10-CM | POA: Diagnosis not present

## 2016-09-17 ENCOUNTER — Ambulatory Visit
Admission: RE | Admit: 2016-09-17 | Discharge: 2016-09-17 | Disposition: A | Discharge status: Home / Self Care | Payer: BLUE CROSS/BLUE SHIELD | Source: Ambulatory Visit | Attending: Radiation Oncology | Admitting: Radiation Oncology

## 2016-09-17 DIAGNOSIS — Z510 Encounter for antineoplastic radiation therapy: Secondary | ICD-10-CM | POA: Diagnosis not present

## 2016-09-18 ENCOUNTER — Encounter: Payer: Self-pay | Admitting: Radiation Oncology

## 2016-09-18 ENCOUNTER — Ambulatory Visit
Admission: RE | Admit: 2016-09-18 | Discharge: 2016-09-18 | Disposition: A | Discharge status: Home / Self Care | Payer: BLUE CROSS/BLUE SHIELD | Source: Ambulatory Visit | Attending: Radiation Oncology | Admitting: Radiation Oncology

## 2016-09-18 VITALS — BP 117/87 | HR 86 | Temp 97.6°F | Resp 18 | Ht 67.0 in | Wt 160.0 lb

## 2016-09-18 DIAGNOSIS — Z170 Estrogen receptor positive status [ER+]: Secondary | ICD-10-CM

## 2016-09-18 DIAGNOSIS — Z510 Encounter for antineoplastic radiation therapy: Secondary | ICD-10-CM | POA: Diagnosis not present

## 2016-09-18 DIAGNOSIS — C50411 Malignant neoplasm of upper-outer quadrant of right female breast: Secondary | ICD-10-CM

## 2016-09-18 NOTE — Progress Notes (Addendum)
Weekly rad txs right 32/33 completed, Breast, mild erythema with dermatitis and itchiness state has improved., No pain, using radiaplex bid, Appetite good.   Denies  fatigue.  EOT skin instructions discussed.  One month follow up card given.  See documentation under education area. Wt Readings from Last 3 Encounters:  09/18/16 160 lb (72.6 kg)  09/11/16 162 lb 9.6 oz (73.8 kg)  09/02/16 161 lb (73 kg)  BP 117/87   Pulse 86   Temp 97.6 F (36.4 C) (Oral)   Resp 18   Ht 5\' 7"  (1.702 m)   Wt 160 lb (72.6 kg)   SpO2 100%   BMI 25.06 kg/m

## 2016-09-18 NOTE — Progress Notes (Signed)
Department of Radiation Oncology  Phone:  [phone] Fax:        [phone]  Weekly Treatment Note    Name: Dominique Murphy Date: 09/18/2016 MRN: [id number] DOB: 1971-02-15   Diagnosis:     ICD-9-CM ICD-10-CM   1. Malignant neoplasm of upper-outer quadrant of right breast in female, estrogen receptor positive ([place]) 174.4 C50.411    V86.0 Z17.0      Current dose: 58.4 Gy  Current fraction: 32   MEDICATIONS: Current Outpatient Prescriptions  Medication Sig Dispense Refill  . diphenhydrAMINE (BENADRYL) 25 MG tablet Take 25 mg by mouth at bedtime as needed.    . hyaluronate sodium (RADIAPLEXRX) GEL Apply 1 application topically 2 (two) times daily.    . Multiple Vitamin (MULTIVITAMIN) tablet Take 1 tablet by mouth daily.    . non-metallic deodorant Jethro Poling) MISC Apply 1 application topically daily as needed.    . ALPRAZolam (XANAX) 0.5 MG tablet Take 1 tablet (0.5 mg total) by mouth at bedtime as needed for anxiety. (Patient not taking: Reported on 09/18/2016) 30 tablet 2  . tamoxifen (NOLVADEX) 20 MG tablet TAKE 1 TABLET (20 MG TOTAL) BY MOUTH DAILY. (Patient not taking: Reported on 09/18/2016) 30 tablet 0  . zolpidem (AMBIEN) 10 MG tablet Take 1 tablet (10 mg total) by mouth at bedtime as needed for sleep. 30 tablet 2   No current facility-administered medications for this encounter.      ALLERGIES: Morphine and related and Percocet [oxycodone-acetaminophen]   LABORATORY DATA:  Lab Results  Component Value Date   WBC 6.3 01/26/2013   HGB 12.6 01/26/2013   HCT 37.6 01/26/2013   MCV 90.8 01/26/2013   PLT 301 01/26/2013   Lab Results  Component Value Date   NA 138 01/31/2014   K 4.4 01/31/2014   CL 103 01/31/2014   CO2 27 01/31/2014   Lab Results  Component Value Date   ALT 19 01/31/2014   AST 26 01/31/2014   ALKPHOS 66 01/31/2014   BILITOT 0.3 01/31/2014     NARRATIVE: Dominique Murphy was seen today for weekly treatment management. The chart was checked  and the patient's films were reviewed.  Weekly rad txs right breast32/33 completed. Mild erythema with dermatitisand itchiness. She states the pruritus has improved. Denies pain or fatigue. Using radiaplex bid. Appetite good.  10:27 AM BP 117/87   Pulse 86   Temp 97.6 F (36.4 C) (Oral)   Resp 18   Ht 5\' 7"  (1.702 m)   Wt 160 lb (72.6 kg)   SpO2 100%   BMI 25.06 kg/m   Wt Readings from Last 3 Encounters:  09/18/16 160 lb (72.6 kg)  09/11/16 162 lb 9.6 oz (73.8 kg)  09/02/16 161 lb (73 kg)    PHYSICAL EXAMINATION: height is 5\' 7"  (1.702 m) and weight is 160 lb (72.6 kg). Her oral temperature is 97.6 F (36.4 C). Her blood pressure is 117/87 and her pulse is 86. Her respiration is 18 and oxygen saturation is 100%.   Erythema and some significant hyperpigmentation in the treatment area. No moist desquamation.  ASSESSMENT: The patient is doing satisfactorily with treatment.  PLAN: We will continue with the patient's radiation treatment as planned. A 1 month follow up appointment Murphy was given to the patient. I advised the patient to continue using radiaplex and then switch to vitamin E lotion/oil 2 weeks later.  ------------------------------------------------  Jodelle Gross, MD, PhD  This document serves as a record of services personally  performed by Kyung Rudd, MD. It was created on his behalf by Darcus Austin, a trained medical scribe. The creation of this record is based on the scribe's personal observations and the provider's statements to them. This document has been checked and approved by the attending provider.

## 2016-09-18 NOTE — Progress Notes (Signed)
Complex simulation note  Diagnosis: right-sided breast cancer  Narrative The patient has initially been planned to receive a course of whole breast radiation to a dose of 50.4 Gy in 28 fractions. The patient will now receive an additional boost to the seroma cavity which has been contoured. This will correspond to a boost of 10 Gy at 2 Gy per fraction. To accomplish this, an additional 3 customized blocks have been designed for this purpose. A complex isodose plan is requested to ensure that the target area is adequately covered with radiation dose and that the nearby normal structures such as the lung are adequately spared. The patient's final total dose will be 6040 Gy.  ------------------------------------------------  Dominique Gross, MD, PhD

## 2016-09-21 ENCOUNTER — Ambulatory Visit
Admission: RE | Admit: 2016-09-21 | Discharge: 2016-09-21 | Disposition: A | Discharge status: Home / Self Care | Payer: BLUE CROSS/BLUE SHIELD | Source: Ambulatory Visit | Attending: Radiation Oncology | Admitting: Radiation Oncology

## 2016-09-21 ENCOUNTER — Encounter: Payer: Self-pay | Admitting: Radiation Oncology

## 2016-09-21 ENCOUNTER — Telehealth: Payer: Self-pay | Admitting: *Deleted

## 2016-09-21 DIAGNOSIS — Z510 Encounter for antineoplastic radiation therapy: Secondary | ICD-10-CM | POA: Diagnosis not present

## 2016-09-21 NOTE — Telephone Encounter (Signed)
Left vm to congratulate pt on completion of xrt. Gave contact information to call with needs or questions.

## 2016-09-23 NOTE — Progress Notes (Signed)
°  Radiation Oncology         [phone]) [phone] ________________________________  Name: Dominique Murphy MRN: [id number]  Date: 09/21/2016  DOB: [birth year]  End of Treatment Note  Diagnosis:   Breast cancer of upper-outer quadrant of right female breast [facility]):  Staging - [id number]       ICD-9-CM ICD-10-CM   1. Malignant neoplasm of upper-outer quadrant of right breast in female, estrogen receptor positive ([place]) 174.4 C50.411    V86.0 Z17.0     Indication for treatment:  Curative       Radiation treatment dates:   08/06/2016 to 09/21/2016  Site/dose:    1. The right breast was treated to 50.4 Gy in 28 fractions at 1.8 Gy per fraction. 2. The right breast was boosted to 10 Gy in 5 fractions at 2 Gy per fraction.   Beams/energy:    1. 3D // 6X 2. Isodose plan // 6X  Narrative: The patient tolerated radiation treatment relatively well. She developed erythema and some significant hyperpigmentation in the treatment area with no moist desquamation.    Plan: The patient has completed radiation treatment. The patient will return to radiation oncology clinic for routine followup in one month. She was advised to continue using radiaplex and then switch to vitamin E lotion/oil 2 weeks later. I advised them to call or return sooner if they have any questions or concerns related to their recovery or treatment.  ------------------------------------------------  Jodelle Gross, MD, PhD  This document serves as a record of services personally performed by Kyung Rudd, MD. It was created on his behalf by Arlyce Harman, a trained medical scribe. The creation of this record is based on the scribe's personal observations and the provider's statements to them. This document has been checked and approved by the attending provider.

## 2016-09-30 ENCOUNTER — Other Ambulatory Visit: Payer: Self-pay | Admitting: *Deleted

## 2016-09-30 DIAGNOSIS — Z170 Estrogen receptor positive status [ER+]: Secondary | ICD-10-CM

## 2016-09-30 DIAGNOSIS — C50411 Malignant neoplasm of upper-outer quadrant of right female breast: Secondary | ICD-10-CM

## 2016-09-30 MED ORDER — TAMOXIFEN CITRATE 20 MG PO TABS: 20.0000 mg | ORAL_TABLET | Freq: Every day | ORAL | 3 refills | Status: DC

## 2016-10-28 NOTE — Progress Notes (Addendum)
Mrs. Dominique Murphy 45 year old female is here for a one month follow up appointment for cancer of right breast.  Skin status:Right breast with mild tanning under the breast. What lotion are you using? Using lotion coconut oil and vitamin E. Have you seen med onc? If not, when is appointment: Dr. Lindi Adie 07-09-16 will see 11-04-16 at 11 a.m. If they are ER+, have they started Al or Tamoxifen? If not, why?  10-05-16 Tamoxifen Discuss survivorship appointment: Not scheduled yet Have you had a mammogram scheduled? Not scheduled yet Offer referral to [employer]/[facility]. Will receive at the survivorship appointment. Appetite:Good Pain:No Fatigue:None Arm mobility:Using right arm with no problem, wears a compression sleeve when she flys Wt Readings from Last 3 Encounters:  11/04/16 156 lb 9.6 oz (71 kg)  09/18/16 160 lb (72.6 kg)  09/11/16 162 lb 9.6 oz (73.8 kg)  Has loss has loss 40 lbs. Over the past years BP 112/83   Pulse 96   Temp 97.5 F (36.4 C) (Oral)   Resp 18   Ht 5\' 7"  (1.702 m)   Wt 156 lb 9.6 oz (71 kg)   SpO2 98%   BMI 24.53 kg/m

## 2016-11-03 ENCOUNTER — Ambulatory Visit: Payer: Self-pay | Admitting: Radiation Oncology

## 2016-11-03 NOTE — Assessment & Plan Note (Signed)
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 Oncotype DX score 12, 8% risk of recurrence  Tamoxifen toxicities: Denies any hot flashes or myalgias. Adj XRT 08/06/16 to 09/21/16  Treatment Plan: Adjuvant antiestrogen therapy with tamoxifen for 5-10 years  Return to clinic in 6 months for follow-up after radiation therapy

## 2016-11-04 ENCOUNTER — Encounter: Payer: Self-pay | Admitting: Hematology and Oncology

## 2016-11-04 ENCOUNTER — Encounter: Payer: Self-pay | Admitting: Radiation Oncology

## 2016-11-04 ENCOUNTER — Ambulatory Visit (HOSPITAL_BASED_OUTPATIENT_CLINIC_OR_DEPARTMENT_OTHER): Payer: BLUE CROSS/BLUE SHIELD | Admitting: Hematology and Oncology

## 2016-11-04 ENCOUNTER — Ambulatory Visit
Admission: RE | Admit: 2016-11-04 | Discharge: 2016-11-04 | Disposition: A | Discharge status: Home / Self Care | Payer: BLUE CROSS/BLUE SHIELD | Source: Ambulatory Visit | Attending: Radiation Oncology | Admitting: Radiation Oncology

## 2016-11-04 VITALS — BP 112/83 | HR 96 | Temp 97.5°F | Resp 18 | Ht 67.0 in | Wt 156.6 lb

## 2016-11-04 DIAGNOSIS — Z170 Estrogen receptor positive status [ER+]: Secondary | ICD-10-CM | POA: Insufficient documentation

## 2016-11-04 DIAGNOSIS — D0511 Intraductal carcinoma in situ of right breast: Secondary | ICD-10-CM

## 2016-11-04 DIAGNOSIS — Z79899 Other long term (current) drug therapy: Secondary | ICD-10-CM | POA: Diagnosis not present

## 2016-11-04 DIAGNOSIS — Z885 Allergy status to narcotic agent status: Secondary | ICD-10-CM | POA: Diagnosis not present

## 2016-11-04 DIAGNOSIS — C50911 Malignant neoplasm of unspecified site of right female breast: Secondary | ICD-10-CM | POA: Diagnosis present

## 2016-11-04 DIAGNOSIS — C50411 Malignant neoplasm of upper-outer quadrant of right female breast: Secondary | ICD-10-CM

## 2016-11-04 NOTE — Progress Notes (Signed)
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 The right breast was treated to 50.4 Gy in 28 fractions at 1.8 Gy per fraction. 2. The right breast was boosted to 10 Gy in 5 fractions at 2 Gy per fraction.  Narrative:  The patient returns today for routine follow-up. During treatment she did very well with radiotherapy and did not have significant desquamation.                             On review of systems, the patient states she is doing great. She's using vitamin e and coconut oil on her skin. She denies any re-accumulation of fluid in her breast, as she did have a previous seroma. She does continue with some neuropathic pain in her right axilla and RUOQ of the breast. She denies chest pain, shortness of breath, fevers, or chills. No other complaints are noted.  ALLERGIES:  is allergic to morphine and related and percocet [oxycodone-acetaminophen].  Meds: Current Outpatient Prescriptions  Medication Sig Dispense Refill  . diphenhydrAMINE (BENADRYL) 25 MG tablet Take 25 mg by mouth at bedtime as needed.    . Multiple Vitamin (MULTIVITAMIN) tablet Take 1 tablet by mouth daily.    . tamoxifen (NOLVADEX) 20 MG tablet TAKE 1 TABLET (20 MG TOTAL) BY MOUTH DAILY. 30 tablet 0  . tamoxifen (NOLVADEX) 20 MG tablet Take 1 tablet (20 mg total) by mouth daily. 90 tablet 3  . ALPRAZolam (XANAX) 0.5 MG tablet Take 1 tablet (0.5 mg total) by mouth at bedtime as needed for anxiety. (Patient not taking: Reported on 11/04/2016) 30 tablet 2  . zolpidem (AMBIEN) 10 MG tablet Take 1 tablet (10 mg total) by mouth at bedtime as needed for sleep. 30 tablet 2   No  current facility-administered medications for this encounter.     Physical Findings:  height is 5\' 7"  (1.702 m) and weight is 156 lb 9.6 oz (71 kg). Her oral temperature is 97.5 F (36.4 C). Her blood pressure is 112/83 and her pulse is 96. Her respiration is 18 and oxygen saturation is 98%.  In general this is a well appearing caucasian female in no acute distress. She's alert and oriented x4 and appropriate throughout the examination. Cardiopulmonary assessment is negative for acute distress and she exhibits normal effort. The right breast was examined and reveals no desquamation, minimal hyperpigmentation of the breast without breakdown.   Lab Findings:  Lab Results  Component Value Date   WBC 6.3 01/26/2013   HGB 12.6 01/26/2013   HCT 37.6 01/26/2013   MCV 90.8 01/26/2013   PLT 301 01/26/2013     Radiographic Findings: No results found.  Impression/Plan: 1.  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 The patient has been doing well since completion of radiotherapy. We discussed that we would be happy to continue to follow her as needed, but she will also continue to follow up with Dr. Lindi Adie and will see him today upstairs in medical oncology. She was counseled on skin care, as  well as encouraged to consider sunscreen over the upper chest wall to avoid sunburn.  2. Survivorship. The patient will be referred to survivorship clinic in the near future by medical oncology.      Carola Rhine, PAC

## 2016-11-04 NOTE — Progress Notes (Signed)
Patient Care Team: Chesley Noon, MD as PCP - General (Family Medicine)  DIAGNOSIS:  Encounter Diagnosis  Name Primary?  . Malignant neoplasm of upper-outer quadrant of right breast in female, estrogen receptor positive ([place])     SUMMARY OF ONCOLOGIC HISTORY:   Breast cancer of upper-outer quadrant of right female breast ([place])   05/11/2016 Initial Diagnosis    Right breast biopsy 9:30 position 7cmfn: Fibrocystic change (5 mm); 5 cmfn: IDC grade 2, DCIS, LN axilla: Biopsy neg; ER 95%, PR 95%, HER-2 negative ratio 1.38, Ki-67 10%; right breast mass 9 mm, T1b N0 stage IA      07/01/2016 Surgery    Right lumpectomy: IDC grade 3, 0.5 cm, IgG DCIS, margins negative, 0/4 SLN negative, ER 95%, PR 95%, HER-2/neu negative, Ki-67 10%, T1 N0 stage IA      08/06/2016 - 09/21/2016 Radiation Therapy    Adj XRT      10/05/2016 -  Anti-estrogen oral therapy    Tamoxifen 20 mg daily       CHIEF COMPLIANT: Follow-up on tamoxifen therapy  INTERVAL HISTORY: Dominique Murphy is a 45 year old with above-mentioned history of right breast DCIS who underwent lumpectomy followed by adjuvant radiation is currently on adjuvant tamoxifen. She appears to be tolerating tamoxifen extremely well. She has not had any hot flashes or myalgias. Her menstrual cycles have not resumed since August. She had a Mirena which was removed.  REVIEW OF SYSTEMS:   Constitutional: Denies fevers, chills or abnormal weight loss Eyes: Denies blurriness of vision Ears, nose, mouth, throat, and face: Denies mucositis or sore throat Respiratory: Denies cough, dyspnea or wheezes Cardiovascular: Denies palpitation, chest discomfort Gastrointestinal:  Denies nausea, heartburn or change in bowel habits Skin: Denies abnormal skin rashes Lymphatics: Denies new lymphadenopathy or easy bruising Neurological:Denies numbness, tingling or new weaknesses Behavioral/Psych: Mood is stable, no new changes  Extremities: No lower extremity  edema Breast:  denies any pain or lumps or nodules in either breasts All other systems were reviewed with the patient and are negative.  I have reviewed the past medical history, past surgical history, social history and family history with the patient and they are unchanged from previous note.  ALLERGIES:  is allergic to morphine and related and percocet [oxycodone-acetaminophen].  MEDICATIONS:  Current Outpatient Prescriptions  Medication Sig Dispense Refill  . ALPRAZolam (XANAX) 0.5 MG tablet Take 1 tablet (0.5 mg total) by mouth at bedtime as needed for anxiety. (Patient not taking: Reported on 11/04/2016) 30 tablet 2  . diphenhydrAMINE (BENADRYL) 25 MG tablet Take 25 mg by mouth at bedtime as needed.    . Multiple Vitamin (MULTIVITAMIN) tablet Take 1 tablet by mouth daily.    . tamoxifen (NOLVADEX) 20 MG tablet Take 1 tablet (20 mg total) by mouth daily. 90 tablet 3  . zolpidem (AMBIEN) 10 MG tablet Take 1 tablet (10 mg total) by mouth at bedtime as needed for sleep. 30 tablet 2   No current facility-administered medications for this visit.     PHYSICAL EXAMINATION: ECOG PERFORMANCE STATUS: 0 - Asymptomatic  Vitals:   11/04/16 1108  BP: 120/81  Pulse: (!) 105  Resp: 18  Temp: 97.5 F (36.4 C)   Filed Weights   11/04/16 1108  Weight: 157 lb 1.6 oz (71.3 kg)    GENERAL:alert, no distress and comfortable SKIN: skin color, texture, turgor are normal, no rashes or significant lesions EYES: normal, Conjunctiva are pink and non-injected, sclera clear OROPHARYNX:no exudate, no erythema and lips,  buccal mucosa, and tongue normal  NECK: supple, thyroid normal size, non-tender, without nodularity LYMPH:  no palpable lymphadenopathy in the cervical, axillary or inguinal LUNGS: clear to auscultation and percussion with normal breathing effort HEART: regular rate & rhythm and no murmurs and no lower extremity edema ABDOMEN:abdomen soft, non-tender and normal bowel  sounds MUSCULOSKELETAL:no cyanosis of digits and no clubbing  NEURO: alert & oriented x 3 with fluent speech, no focal motor/sensory deficits EXTREMITIES: No lower extremity edema  LABORATORY DATA:  I have reviewed the data as listed   Chemistry      Component Value Date/Time   NA 138 01/31/2014 1042   K 4.4 01/31/2014 1042   CL 103 01/31/2014 1042   CO2 27 01/31/2014 1042   BUN 18 01/31/2014 1042   CREATININE 0.76 01/31/2014 1042      Component Value Date/Time   CALCIUM 9.2 01/31/2014 1042   ALKPHOS 66 01/31/2014 1042   AST 26 01/31/2014 1042   ALT 19 01/31/2014 1042   BILITOT 0.3 01/31/2014 1042       Lab Results  Component Value Date   WBC 6.3 01/26/2013   HGB 12.6 01/26/2013   HCT 37.6 01/26/2013   MCV 90.8 01/26/2013   PLT 301 01/26/2013   NEUTROABS 3.3 01/26/2013    ASSESSMENT & PLAN:  Breast cancer of upper-outer quadrant of right female breast ([place]) Right lumpectomy 07/01/2016: IDC grade 3, 0.5 cm, IgG DCIS, margins negative, 0/4 SLN negative, ER 95%, PR 95%, HER-2/neu negative, Ki-67 10%, T1 N0 stage IA Patient had started tamoxifen 20 mg daily 05/19/2016 because surgery was slightly delayed. Oncotype DX score 12, 8% risk of recurrence  Tamoxifen toxicities: Denies any hot flashes or myalgias. Adj XRT 08/06/16 to 09/21/16  Treatment Plan: Adjuvant antiestrogen therapy with tamoxifen for 5-10 years  Return to clinic in 6 months for follow-up and after that she can be followed annually.   Orders Placed This Encounter  Procedures  . MM DIAG BREAST TOMO BILATERAL    Standing Status:   Future    Standing Expiration Date:   01/05/2018    Order Specific Question:   Reason for Exam (SYMPTOM  OR DIAGNOSIS REQUIRED)    Answer:   Annual mammogram for breast cancer    Order Specific Question:   Is the patient pregnant?    Answer:   No    Order Specific Question:   Preferred imaging location?    Answer:   [facility]   The patient has a good  understanding of the overall plan. she agrees with it. she will call with any problems that may develop before the next visit here.   Rulon Eisenmenger, MD 11/04/16

## 2016-11-04 NOTE — Addendum Note (Signed)
Encounter addended by: Malena Edman, RN on: 11/04/2016 12:46 PM<BR>    Actions taken: Charge Capture section accepted

## 2016-11-05 ENCOUNTER — Other Ambulatory Visit: Payer: Self-pay | Admitting: Otolaryngology

## 2016-11-05 DIAGNOSIS — H9312 Tinnitus, left ear: Secondary | ICD-10-CM

## 2016-11-16 DIAGNOSIS — E039 Hypothyroidism, unspecified: Secondary | ICD-10-CM | POA: Insufficient documentation

## 2016-11-17 ENCOUNTER — Encounter: Payer: Self-pay | Admitting: Gynecology

## 2016-11-18 ENCOUNTER — Other Ambulatory Visit: Payer: BLUE CROSS/BLUE SHIELD

## 2016-11-20 ENCOUNTER — Inpatient Hospital Stay: Admission: RE | Admit: 2016-11-20 | Payer: BLUE CROSS/BLUE SHIELD | Source: Ambulatory Visit

## 2016-11-23 ENCOUNTER — Ambulatory Visit
Admission: RE | Admit: 2016-11-23 | Discharge: 2016-11-23 | Disposition: A | Discharge status: Home / Self Care | Payer: BLUE CROSS/BLUE SHIELD | Source: Ambulatory Visit | Attending: Otolaryngology | Admitting: Otolaryngology

## 2016-11-23 DIAGNOSIS — H9312 Tinnitus, left ear: Secondary | ICD-10-CM

## 2016-11-23 MED ORDER — GADOBENATE DIMEGLUMINE 529 MG/ML IV SOLN
14.0000 mL | Freq: Once | INTRAVENOUS | Status: AC | PRN
  Administered 2016-11-23: 14 mL via INTRAVENOUS

## 2016-12-09 ENCOUNTER — Other Ambulatory Visit: Payer: Self-pay | Admitting: *Deleted

## 2016-12-09 DIAGNOSIS — Z170 Estrogen receptor positive status [ER+]: Secondary | ICD-10-CM

## 2016-12-09 DIAGNOSIS — C50411 Malignant neoplasm of upper-outer quadrant of right female breast: Secondary | ICD-10-CM

## 2016-12-09 MED ORDER — TAMOXIFEN CITRATE 20 MG PO TABS: 20.0000 mg | ORAL_TABLET | Freq: Every day | ORAL | 3 refills | Status: DC

## 2016-12-17 DIAGNOSIS — R7989 Other specified abnormal findings of blood chemistry: Secondary | ICD-10-CM

## 2016-12-17 HISTORY — DX: Other specified abnormal findings of blood chemistry: R79.89

## 2016-12-23 ENCOUNTER — Encounter: Payer: Self-pay | Admitting: Gynecology

## 2016-12-23 NOTE — Telephone Encounter (Signed)
Patient needs office visit for evaluation

## 2016-12-30 ENCOUNTER — Ambulatory Visit (INDEPENDENT_AMBULATORY_CARE_PROVIDER_SITE_OTHER): Payer: BLUE CROSS/BLUE SHIELD | Admitting: Gynecology

## 2016-12-30 ENCOUNTER — Encounter: Payer: Self-pay | Admitting: Gynecology

## 2016-12-30 VITALS — BP 118/76

## 2016-12-30 DIAGNOSIS — N912 Amenorrhea, unspecified: Secondary | ICD-10-CM

## 2016-12-30 NOTE — Progress Notes (Signed)
    RAIMA DORNBUSCH 05/29/1971 [id number]        46 y.o.  G1P1001 presents having had her Mirena IUD removed 06/2016 after her diagnosis of breast cancer and has not had a menses since then. No hot flushes or sweats. No skin hair or weight changes. Is sexually active and intermittently using condoms but not consistently. Currently on tamoxifen.  Past medical history,surgical history, problem list, medications, allergies, family history and social history were all reviewed and documented in the EPIC chart.  Directed ROS with pertinent positives and negatives documented in the history of present illness/assessment and plan.  Exam: [name] assistant Vitals:   12/30/16 1435  BP: 118/76   General appearance:  Normal Abdomen soft nontender without masses guarding rebound External BUS vagina normal. Cervix normal. Uterus normal size midline mobile nontender. Adnexa without masses or tenderness.  Assessment/Plan:  46 y.o. G1P1001 with amenorrhea since removal of her IUD. Without other symptoms to suggest menopause. Currently on tamoxifen for breast cancer. Check baseline labs to include [place] TSH prolactin and hCG. Need to consistently use backup contraception reviewed. Differential to include menopause versus anovulatory discussed. Increased risk of hyperplasia/polyps associated with tamoxifen. If menopausal them will plan expectant management. If anovulatory then discussed intermittent progesterone withdrawal versus baseline sampling and sampling at some determined interval. The issues of hormonal exposure in patient with breast cancer discussed. Will rediscuss after lab work becomes available.  Greater than 50% of my time was spent in direct face to face counseling and coordination of care with the patient.    Anastasio Auerbach MD, 2:51 PM 12/30/2016

## 2016-12-30 NOTE — Patient Instructions (Signed)
Office will follow up with you for your blood test results

## 2016-12-31 ENCOUNTER — Encounter: Payer: Self-pay | Admitting: Gynecology

## 2016-12-31 LAB — THYROID PROFILE - CHCC
Free Thyroxine Index: 1.9 (ref 1.4–3.8)
T3 Uptake: 31 % (ref 22–35)
T4, Total: 6.2 ug/dL (ref 4.5–12.0)

## 2016-12-31 LAB — PROLACTIN: Prolactin: 9.1 ng/mL

## 2016-12-31 LAB — HCG, SERUM, QUALITATIVE: Preg, Serum: NEGATIVE

## 2016-12-31 LAB — FOLLICLE STIMULATING HORMONE: FSH: 49.7 m[IU]/mL

## 2017-01-18 ENCOUNTER — Encounter: Payer: Self-pay | Admitting: Gynecology

## 2017-01-18 NOTE — Telephone Encounter (Signed)
Treatment for cold sores is 2 g twice daily first day of outbreak and that is it. Okay for Valtrex 1000 mg tablets, #4 sig 2 tablets every 12 hours first day of outbreak with 5 refills

## 2017-01-19 MED ORDER — VALACYCLOVIR HCL 1 G PO TABS: ORAL_TABLET | ORAL | 5 refills | Status: DC

## 2017-02-11 ENCOUNTER — Encounter: Payer: Self-pay | Admitting: Internal Medicine

## 2017-02-12 ENCOUNTER — Telehealth: Payer: Self-pay | Admitting: Adult Health

## 2017-02-12 NOTE — Telephone Encounter (Signed)
The Survivorship Care Plan was mailed to Dominique Murphy as she reported not being able to come in to the Survivorship Clinic for an in-person visit at this time. A letter was mailed to her outlining the purpose of the content of the care plan, as well as encouraging her to reach out to me with any questions or concerns.  .  Additional resources regarding healthy eating, recommendations for exercise, [employer] program, and brochures for support services were also included in the mailed packet.  A shorter version of the care plan was also routed/faxed/mailed to Dominique Noon, Dominique Murphy, the patient's PCP.  I will not be placing any follow-up appointments to the Survivorship Clinic for Dominique Murphy, but I am happy to see her at any time in the future for any survivorship concerns that may arise. Thank you for allowing me to participate in her care!  Gardenia Phlegm, [place] [phone]

## 2017-02-15 ENCOUNTER — Encounter: Payer: Self-pay | Admitting: Internal Medicine

## 2017-03-08 ENCOUNTER — Other Ambulatory Visit: Payer: Self-pay | Admitting: Hematology and Oncology

## 2017-03-08 ENCOUNTER — Other Ambulatory Visit: Payer: Self-pay

## 2017-03-08 DIAGNOSIS — C50411 Malignant neoplasm of upper-outer quadrant of right female breast: Secondary | ICD-10-CM

## 2017-03-08 DIAGNOSIS — Z170 Estrogen receptor positive status [ER+]: Secondary | ICD-10-CM

## 2017-03-10 ENCOUNTER — Encounter: Payer: Self-pay | Admitting: Neurology

## 2017-03-10 ENCOUNTER — Ambulatory Visit (INDEPENDENT_AMBULATORY_CARE_PROVIDER_SITE_OTHER): Payer: BLUE CROSS/BLUE SHIELD | Admitting: Neurology

## 2017-03-10 VITALS — BP 128/74 | HR 76 | Temp 98.3°F | Resp 16 | Ht 65.25 in | Wt 155.5 lb

## 2017-03-10 DIAGNOSIS — H93A2 Pulsatile tinnitus, left ear: Secondary | ICD-10-CM | POA: Diagnosis not present

## 2017-03-10 NOTE — Patient Instructions (Signed)
I don't think you need the MRV of the head.  Instead, I want to check an MRA of the neck (to look at the arteries int he neck).  I will contact you with results.

## 2017-03-10 NOTE — Progress Notes (Signed)
Forward office notes to PCP and ENT.

## 2017-03-10 NOTE — Progress Notes (Signed)
NEUROLOGY CONSULTATION NOTE  Dominique Murphy MRN: [id number] DOB: Apr 06, 1971  Referring provider: Dr. Thornell Mule Primary care provider: Dr. Melford Aase  Reason for consult:  Pulsatile tinnitus, evaluate for idiopathic intracranial hypertension.  HISTORY OF PRESENT ILLNESS: Dominique Murphy is a 46 year old right-handed female with history of right breast cancer who presents for idiopathic intracranial hypertension.  History supplemented by ENT note.  In December 2015, she developed a sore throat and sinus congestion, including fullness, pain and decreased hearing out of her left ear. She also had associated left sided jaw pain.  She subsequently had a myringotomy and transtympainc tube inserted, which was ineffective.  MRI of brain with and without contrast from 12/26/14 revealed minor inflammatory changes in the left middle ear and mastoid but no acute intracranial abnormality . CT of temporal bones from 01/04/15 revealed normal appearaing inner ear structures, again with only minimal fluid in the left mastoid.  Evaluation for TMJ was negative.  Since then, she continues to have left aural fullness.  For one day in July 2016, she felt a pop and she was able to hear out of the left ear.  On two occasions in 2017, she had brief episodes of vertigo.  Audiogram was revealed slight but stable hearing loss in the left ear (5 DB AD, 15 DB AS with 100% discrimination bilaterally) but nothing significant.  She has chronic bilateeral tinnitus, however she developed pulsatile tinnitus in the left ear about a year ago.  The possibility of idiopathic intracranial hypertension was entertained.  MRI with and without contrast and MRA of head from 11/23/16 were personally reviewed and were unremarkable.  An MRV of the head was ordered but not approved by her insurance.  She denies headache or visual obscurations.  She recently had a full eye examination.  PAST MEDICAL HISTORY: Past Medical History:  Diagnosis Date  . Allergy     . Breast cancer ([place]) 05/11/16   Right Breast   . 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 High serum follicle stimulating hormone [facility]) 12/2016    PAST SURGICAL HISTORY: Past Surgical History:  Procedure Laterality Date  . [place]  2002  . BREAST LUMPECTOMY WITH RADIOACTIVE SEED AND SENTINEL LYMPH NODE BIOPSY Right 07/01/2016   Procedure: RIGHT BREAST LUMPECTOMY WITH RADIOACTIVE SEED X'S 2 AND SENTINEL LYMPH NODE BIOPSY;  Surgeon: Rolm Bookbinder, MD;  Location: [facility];  Service: General;  Laterality: Right;  . CESAREAN SECTION  2000  . COLPOSCOPY    . MIRENA     Inserted 02/20/2014  . WISDOM TOOTH EXTRACTION      MEDICATIONS: Current Outpatient Prescriptions on File Prior to Visit  Medication Sig Dispense Refill  . Multiple Vitamin (MULTIVITAMIN) tablet Take 1 tablet by mouth daily.    . tamoxifen (NOLVADEX) 20 MG tablet Take 1 tablet (20 mg total) by mouth daily. 90 tablet 3  . valACYclovir (VALTREX) 1000 MG tablet Take 2 tabs q 12 hours for first day of outbreak. 4 tablet 5  . ALPRAZolam (XANAX) 0.5 MG tablet Take 1 tablet (0.5 mg total) by mouth at bedtime as needed for anxiety. (Patient not taking: Reported on 03/10/2017) 30 tablet 2  . diphenhydrAMINE (BENADRYL) 25 MG tablet Take 25 mg by mouth at bedtime as needed.    . zolpidem (AMBIEN) 10 MG tablet Take 1 tablet (10 mg total) by mouth at bedtime as needed for sleep. 30 tablet 2  No current facility-administered medications on file prior to visit.     ALLERGIES: Allergies  Allergen Reactions  . Morphine And Related Nausea And Vomiting  . Percocet [Oxycodone-Acetaminophen] Itching    FAMILY HISTORY: Family History  Problem Relation Age of Onset  . Colon cancer Father 4  . Heart disease Maternal Grandfather   . Skin cancer Sister 74  . Colon cancer Paternal Grandfather 4    PGF Brother    SOCIAL HISTORY: Social History    Social History  . Marital status: Married    Spouse name: Marya Amsler  . Number of children: 1  . 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 Smoking status: Never Smoker  . Smokeless tobacco: Never Used  . Alcohol use 1.2 oz/week    2 Standard drinks or equivalent per week  . Drug use: No  . Sexual activity: Yes    Birth control/ protection: IUD     Comment: Mirena inserted 02/20/2014-1st intercourse 46 yo-Fewer than 5 partners   Other Topics Concern  . Not on file   Social History Narrative  . No narrative on file    REVIEW OF SYSTEMS: Constitutional: No fevers, chills, or sweats, no generalized fatigue, change in appetite Eyes: No visual changes, double vision, eye pain Ear, nose and throat: as above. Cardiovascular: No chest pain, palpitations Respiratory:  No shortness of breath at rest or with exertion, wheezes GastrointestinaI: No nausea, vomiting, diarrhea, abdominal pain, fecal incontinence Genitourinary:  No dysuria, urinary retention or frequency Musculoskeletal:  No neck pain, back pain Integumentary: No rash, pruritus, skin lesions Neurological: as above Psychiatric: No depression, insomnia, anxiety Endocrine: No palpitations, fatigue, diaphoresis, mood swings, change in appetite, change in weight, increased thirst Hematologic/Lymphatic:  No purpura, petechiae. Allergic/Immunologic: no itchy/runny eyes, nasal congestion, recent allergic reactions, rashes  PHYSICAL EXAM: Vitals:   03/10/17 1453  BP: 128/74  Pulse: 76  Resp: 16  Temp: 98.3 F (36.8 C)   General: No acute distress.  Patient appears well-groomed.  Head:  Normocephalic/atraumatic Eyes:  fundi unremarkable.  Optic discs with clear margins. Neck: supple, no paraspinal tenderness, full range of motion Back: No paraspinal tenderness Heart: regular rate and rhythm Lungs: Clear to auscultation bilaterally. Vascular: No carotid  bruits. Neurological Exam: Mental status: alert and oriented to person, place, and time, recent and remote memory intact, fund of knowledge intact, attention and concentration intact, speech fluent and not dysarthric, language intact. Cranial nerves: CN I: not tested CN II: pupils equal, round and reactive to light, visual fields intact CN III, IV, VI:  full range of motion, no nystagmus, no ptosis CN V: facial sensation intact CN VII: upper and lower face symmetric CN VIII: hearing intact CN IX, X: gag intact, uvula midline CN XI: sternocleidomastoid and trapezius muscles intact CN XII: tongue midline Bulk & Tone: normal, no fasciculations. Motor:  5/5 throughout  Sensation: temperature and vibration sensation intact. Deep Tendon Reflexes:  2+ throughout, toes downgoing.  Finger to nose testing:  Without dysmetria.  Heel to shin:  Without dysmetria.  Gait:  Normal station and stride.  Able to turn and tandem walk. Romberg negative.  IMPRESSION: Left pulsatile tinnitus.  I do not believe that she has idiopathic intracranial hypertension.  She does not exhibit papilledema on exam.  She does not endorse associated symptoms (positional headache, visual obscurations).  Therefore, I don't think an MRV is necessary. Unfortunately, we often are unable  to find a cause.  PLAN: 1.  To complete workup looking for a vascular cause for pulsatile tinnitus, I will order MRA of the neck.  I will contact her with results and whether further workup is warranted. 2.  We discussed symptomatic management for tinnitus such as using white noise machine and cognitive behavioral therapy.  She has information from the tinnitus clinic at [facility].  Thank you for allowing me to take part in the care of this patient.  Metta Clines, DO  CC:  Vicie Mutters, MD  Anastasia Pall, MD

## 2017-03-11 ENCOUNTER — Other Ambulatory Visit: Payer: Self-pay | Admitting: Neurology

## 2017-03-12 ENCOUNTER — Telehealth: Payer: Self-pay | Admitting: Neurology

## 2017-03-12 ENCOUNTER — Telehealth: Payer: Self-pay

## 2017-03-12 ENCOUNTER — Other Ambulatory Visit: Payer: Self-pay | Admitting: Neurology

## 2017-03-12 ENCOUNTER — Ambulatory Visit
Admission: RE | Admit: 2017-03-12 | Discharge: 2017-03-12 | Disposition: A | Discharge status: Home / Self Care | Payer: BLUE CROSS/BLUE SHIELD | Source: Ambulatory Visit | Attending: Neurology | Admitting: Neurology

## 2017-03-12 MED ORDER — GADOBENATE DIMEGLUMINE 529 MG/ML IV SOLN
15.0000 mL | Freq: Once | INTRAVENOUS | Status: AC | PRN
  Administered 2017-03-12: 14 mL via INTRAVENOUS

## 2017-03-12 NOTE — Telephone Encounter (Signed)
Authorization [id number], [facility] aware.

## 2017-03-12 NOTE — Telephone Encounter (Signed)
Call to [facility] for MRI precert.  [id number]  Auth [id number].  Noelle at [facility] imaging informed.

## 2017-03-12 NOTE — Telephone Encounter (Signed)
Caller: Noelle  Urgent? Yes  Reason for the call: She is needing pre Authorization for this patient whi is coming in at 4:00 today. Her # is [phone]. Thanks

## 2017-03-16 ENCOUNTER — Telehealth: Payer: Self-pay | Admitting: Neurology

## 2017-03-16 ENCOUNTER — Telehealth: Payer: Self-pay

## 2017-03-16 DIAGNOSIS — H93A2 Pulsatile tinnitus, left ear: Secondary | ICD-10-CM

## 2017-03-16 NOTE — Telephone Encounter (Signed)
-----   Message from Pieter Partridge, DO sent at 03/15/2017  6:57 AM EDT ----- MRA of neck doesn't show anything obvious but there is suggesting of a tiny bump on one of the arteries.  It may be just artifact, so I would like to get a CTA of the neck to better evaluate.

## 2017-03-16 NOTE — Telephone Encounter (Signed)
Call to patient insurance for [id number].  Auth# [id number].

## 2017-03-16 NOTE — Telephone Encounter (Signed)
Left message for patient to call office regarding MRA results and CT referral.  Left message for CT to contact patient to schedule.

## 2017-03-16 NOTE — Telephone Encounter (Signed)
Left message for patient to call back regarding her MRI results and recommendations.

## 2017-03-16 NOTE — Telephone Encounter (Signed)
VM-PT returned your call

## 2017-03-17 ENCOUNTER — Ambulatory Visit (AMBULATORY_SURGERY_CENTER): Payer: Self-pay | Admitting: *Deleted

## 2017-03-17 VITALS — Ht 66.0 in | Wt 155.0 lb

## 2017-03-17 DIAGNOSIS — Z800 Family history of malignant neoplasm of digestive organs: Secondary | ICD-10-CM

## 2017-03-17 MED ORDER — NA SULFATE-K SULFATE-MG SULF 17.5-3.13-1.6 GM/177ML PO SOLN: 1.0000 | Freq: Once | ORAL | 0 refills | Status: AC

## 2017-03-17 NOTE — Progress Notes (Signed)
No egg or soy allergy known to patient  No issues with past sedation with any surgeries  or procedures, no intubation problems  No diet pills per patient No home 02 use per patient  No blood thinners per patient  Pt denies issues with constipation  No A fib or A flutter  EMMI video declined after multiple offers

## 2017-03-24 ENCOUNTER — Ambulatory Visit (INDEPENDENT_AMBULATORY_CARE_PROVIDER_SITE_OTHER)
Admission: RE | Admit: 2017-03-24 | Discharge: 2017-03-24 | Disposition: A | Discharge status: Home / Self Care | Payer: BLUE CROSS/BLUE SHIELD | Source: Ambulatory Visit | Attending: Neurology | Admitting: Neurology

## 2017-03-24 DIAGNOSIS — H93A2 Pulsatile tinnitus, left ear: Secondary | ICD-10-CM | POA: Diagnosis not present

## 2017-03-24 MED ORDER — IOPAMIDOL (ISOVUE-370) INJECTION 76%
80.0000 mL | Freq: Once | INTRAVENOUS | Status: AC | PRN
  Administered 2017-03-24: 80 mL via INTRAVENOUS

## 2017-03-25 ENCOUNTER — Telehealth: Payer: Self-pay

## 2017-03-25 NOTE — Telephone Encounter (Signed)
-----   Message from Pieter Partridge, DO sent at 03/25/2017  7:14 AM EDT ----- CTA of neck overall looks unremarkable.  It does not reveal anything concerning and reveals no explanation for the pulsatile tinnitus.  Of note, a small segment of the right internal carotid artery shows some irregularity but it is an incidental finding, nothing significant or anything to worry about.

## 2017-03-25 NOTE — Telephone Encounter (Signed)
Left message informing patient of CTA findings.  Also advised mychart message would be sent.

## 2017-03-30 ENCOUNTER — Telehealth: Payer: Self-pay | Admitting: Neurology

## 2017-03-30 NOTE — Telephone Encounter (Signed)
Caller: Almyra Free  Urgent? No  Reason for the call: Received a call on 03/25/17 from [facility] regarding results. She had some questions. Thanks

## 2017-03-31 ENCOUNTER — Encounter: Payer: Self-pay | Admitting: Gynecology

## 2017-04-01 ENCOUNTER — Ambulatory Visit (AMBULATORY_SURGERY_CENTER): Payer: BLUE CROSS/BLUE SHIELD | Admitting: Internal Medicine

## 2017-04-01 ENCOUNTER — Encounter: Payer: Self-pay | Admitting: Internal Medicine

## 2017-04-01 VITALS — BP 127/72 | HR 59 | Temp 97.5°F | Resp 12 | Ht 66.0 in | Wt 155.0 lb

## 2017-04-01 DIAGNOSIS — Z800 Family history of malignant neoplasm of digestive organs: Secondary | ICD-10-CM

## 2017-04-01 DIAGNOSIS — Z1212 Encounter for screening for malignant neoplasm of rectum: Secondary | ICD-10-CM | POA: Diagnosis not present

## 2017-04-01 DIAGNOSIS — Z1211 Encounter for screening for malignant neoplasm of colon: Secondary | ICD-10-CM | POA: Diagnosis present

## 2017-04-01 HISTORY — PX: COLONOSCOPY: SHX174

## 2017-04-01 NOTE — Telephone Encounter (Signed)
CT results and message sent to patient.

## 2017-04-01 NOTE — Op Note (Signed)
[facility] Patient Name: Dominique Murphy Procedure Date: 04/01/2017 1:16 PM MRN: [id number] Endoscopist: Jerene Bears , MD Age: 46 Referring MD:  Date of Birth: 31-Oct-1971 Gender: Female Account #: [id number] Procedure:                Colonoscopy Indications:              Screening in patient at increased risk: Family                            history of 1st-degree relative with colorectal                            cancer before age 37 years, Last colonoscopy 5                            years ago Medicines:                Monitored Anesthesia Care Procedure:                Pre-Anesthesia Assessment:                           - Prior to the procedure, a History and Physical                            was performed, and patient medications and                            allergies were reviewed. The patient's tolerance of                            previous anesthesia was also reviewed. The risks                            and benefits of the procedure and the sedation                            options and risks were discussed with the patient.                            All questions were answered, and informed consent                            was obtained. Prior Anticoagulants: The patient has                            taken no previous anticoagulant or antiplatelet                            agents. ASA Grade Assessment: II - A patient with                            mild systemic disease. After reviewing the risks  and benefits, the patient was deemed in                            satisfactory condition to undergo the procedure.                           After obtaining informed consent, the colonoscope                            was passed under direct vision. Throughout the                            procedure, the patient's blood pressure, pulse, and                            oxygen saturations were monitored continuously. The                 Model PCF-H190DL [id number]) scope was introduced                            through the anus and advanced to the the cecum,                            identified by appendiceal orifice and ileocecal                            valve. The colonoscopy was performed without                            difficulty. The patient tolerated the procedure                            well. The quality of the bowel preparation was                            good. The ileocecal valve, appendiceal orifice, and                            rectum were photographed. Scope In: 1:23:13 PM Scope Out: 1:36:39 PM Scope Withdrawal Time: 0 hours 9 minutes 58 seconds  Total Procedure Duration: 0 hours 13 minutes 26 seconds  Findings:                 The perianal and digital rectal examinations were                            normal.                           The entire examined colon appeared normal on direct                            and retroflexion views. Complications:            No immediate complications. Estimated Blood Loss:     Estimated blood loss: none. Impression:               -  The entire examined colon is normal on direct and                            retroflexion views.                           - No specimens collected. Recommendation:           - Patient has a contact number available for                            emergencies. The signs and symptoms of potential                            delayed complications were discussed with the                            patient. Return to normal activities tomorrow.                            Written discharge instructions were provided to the                            patient.                           - Resume previous diet.                           - Continue present medications.                           - Repeat colonoscopy in 5 years for screening                            purposes given family history of colon cancer. Jerene Bears, MD 04/01/2017 1:40:20 PM This report has been signed electronically.

## 2017-04-01 NOTE — Progress Notes (Signed)
Pt has bruises all over body from participating in an obstacle course this past weekend.

## 2017-04-01 NOTE — Telephone Encounter (Signed)
Caller: PT  Urgent? No  Reason for the call: PT called regarding her CT scan results

## 2017-04-01 NOTE — Telephone Encounter (Signed)
Left message on machine for patient to call back.

## 2017-04-01 NOTE — Progress Notes (Signed)
Report given to PACU, vss 

## 2017-04-01 NOTE — Patient Instructions (Signed)
YOU HAD AN ENDOSCOPIC PROCEDURE TODAY AT THE [facility]:   Refer to the procedure report that was given to you for any specific questions about what was found during the examination.  If the procedure report does not answer your questions, please call your gastroenterologist to clarify.  If you requested that your care partner not be given the details of your procedure findings, then the procedure report has been included in a sealed envelope for you to review at your convenience later.  YOU SHOULD EXPECT: Some feelings of bloating in the abdomen. Passage of more gas than usual.  Walking can help get rid of the air that was put into your GI tract during the procedure and reduce the bloating. If you had a lower endoscopy (such as a colonoscopy or flexible sigmoidoscopy) you may notice spotting of blood in your stool or on the toilet paper. If you underwent a bowel prep for your procedure, you may not have a normal bowel movement for a few days.  Please Note:  You might notice some irritation and congestion in your nose or some drainage.  This is from the oxygen used during your procedure.  There is no need for concern and it should clear up in a day or so.  SYMPTOMS TO REPORT IMMEDIATELY:   Following lower endoscopy (colonoscopy or flexible sigmoidoscopy):  Excessive amounts of blood in the stool  Significant tenderness or worsening of abdominal pains  Swelling of the abdomen that is new, acute  Fever of 100F or higher  For urgent or emergent issues, a gastroenterologist can be reached at any hour by calling [phone].   DIET:  We do recommend a small meal at first, but then you may proceed to your regular diet.  Drink plenty of fluids but you should avoid alcoholic beverages for 24 hours.  MEDICATIONS:  Continue present medications.  ACTIVITY:  You should plan to take it easy for the rest of today and you should NOT DRIVE or use heavy machinery until tomorrow (because of the  sedation medicines used during the test).    FOLLOW UP: Our staff will call the number listed on your records the next business day following your procedure to check on you and address any questions or concerns that you may have regarding the information given to you following your procedure. If we do not reach you, we will leave a message.  However, if you are feeling well and you are not experiencing any problems, there is no need to return our call.  We will assume that you have returned to your regular daily activities without incident.  If any biopsies were taken you will be contacted by phone or by letter within the next 1-3 weeks.  Please call us at [phone] if you have not heard about the biopsies in 3 weeks.   Thank you for allowing us to provide for your healthcare needs today.   SIGNATURES/CONFIDENTIALITY: You and/or your care partner have signed paperwork which will be entered into your electronic medical record.  These signatures attest to the fact that that the information above on your After Visit Summary has been reviewed and is understood.  Full responsibility of the confidentiality of this discharge information lies with you and/or your care-partner. 

## 2017-04-02 ENCOUNTER — Telehealth: Payer: Self-pay | Admitting: *Deleted

## 2017-04-02 NOTE — Telephone Encounter (Signed)
No answer, left message to call if questions or concerns. 

## 2017-04-02 NOTE — Telephone Encounter (Signed)
  Follow up Call-  Call back number 04/01/2017  Post procedure Call Back phone  # [phone]  Permission to leave phone message Yes  Some recent data might be hidden     Patient questions:  Do you have a fever, pain , or abdominal swelling? No. Pain Score  0 *  Have you tolerated food without any problems? Yes.    Have you been able to return to your normal activities? Yes.    Do you have any questions about your discharge instructions: Diet   No. Medications  No. Follow up visit  No.  Do you have questions or concerns about your Care? Yes.    Actions: * If pain score is 4 or above: No action needed, pain <4.

## 2017-04-26 ENCOUNTER — Encounter: Payer: Self-pay | Admitting: Hematology and Oncology

## 2017-04-28 ENCOUNTER — Ambulatory Visit: Payer: BLUE CROSS/BLUE SHIELD | Admitting: Hematology and Oncology

## 2017-04-29 ENCOUNTER — Ambulatory Visit
Admission: RE | Admit: 2017-04-29 | Discharge: 2017-04-29 | Disposition: A | Discharge status: Home / Self Care | Payer: BLUE CROSS/BLUE SHIELD | Source: Ambulatory Visit | Attending: Hematology and Oncology | Admitting: Hematology and Oncology

## 2017-04-29 DIAGNOSIS — C50411 Malignant neoplasm of upper-outer quadrant of right female breast: Secondary | ICD-10-CM

## 2017-04-29 DIAGNOSIS — Z170 Estrogen receptor positive status [ER+]: Secondary | ICD-10-CM

## 2017-04-29 HISTORY — DX: Personal history of irradiation: Z92.3

## 2017-05-05 ENCOUNTER — Ambulatory Visit: Payer: BLUE CROSS/BLUE SHIELD | Admitting: Hematology and Oncology

## 2017-05-10 ENCOUNTER — Ambulatory Visit: Payer: BLUE CROSS/BLUE SHIELD | Admitting: Hematology and Oncology

## 2017-05-14 ENCOUNTER — Ambulatory Visit: Payer: BLUE CROSS/BLUE SHIELD | Admitting: Adult Health

## 2017-05-27 ENCOUNTER — Encounter: Payer: BLUE CROSS/BLUE SHIELD | Admitting: Gynecology

## 2017-06-01 ENCOUNTER — Encounter: Payer: Self-pay | Admitting: Hematology and Oncology

## 2017-06-01 ENCOUNTER — Ambulatory Visit (HOSPITAL_BASED_OUTPATIENT_CLINIC_OR_DEPARTMENT_OTHER): Payer: BLUE CROSS/BLUE SHIELD | Admitting: Hematology and Oncology

## 2017-06-01 DIAGNOSIS — C50411 Malignant neoplasm of upper-outer quadrant of right female breast: Secondary | ICD-10-CM

## 2017-06-01 DIAGNOSIS — Z170 Estrogen receptor positive status [ER+]: Secondary | ICD-10-CM

## 2017-06-01 MED ORDER — TAMOXIFEN CITRATE 20 MG PO TABS: 20.0000 mg | ORAL_TABLET | Freq: Every day | ORAL | 3 refills | Status: DC

## 2017-06-01 NOTE — Assessment & Plan Note (Signed)
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 Oncotype DX score 12, 8% risk of recurrence  Tamoxifen toxicities:Denies any hot flashes or myalgias. Adj XRT 08/06/16 to 09/21/16  Treatment Plan: Adjuvant antiestrogen therapy with tamoxifen for 5-10 years  Tamoxifen toxicities:  Breast cancer surveillance: 1. Breast exam 06/01/2017: Benign 2. mammogram 04/29/2017: Benign breast density category C

## 2017-06-01 NOTE — Progress Notes (Signed)
Patient Care Team: Chesley Noon, MD as PCP - General (Family Medicine) Rolm Bookbinder, MD as Consulting Physician (General Surgery) Nicholas Lose, MD as Consulting Physician (Hematology and Oncology) Dominique Bison Charlestine Massed, NP as Nurse Practitioner (Hematology and Oncology) Kyung Rudd, MD as Consulting Physician (Radiation Oncology)  DIAGNOSIS:  Encounter Diagnosis  Name Primary?  . 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  Genetic testing did not reveal a deleterious mutation.  Genes tested include: APC, ATM, AXIN2, BARD1, BMPR1A, BRCA1, BRCA2, BRIP1, CDH1, CDKN2A, CHEK2, DICER1, EPCAM, GREM1, KIT, MEN1, MLH1, MSH2, MSH6, MUTYH, NBN, NF1, PALB2, PDGFRA, PMS2, POLD1, POLE, PTEN, RAD50, RAD51C, RAD51D, SDHA, SDHB, SDHC, SDHD, SMAD4, SMARCA4. STK11, TP53, TSC1, TSC2, and VHL.      07/01/2016 Surgery    Right lumpectomy: IDC grade 3, 0.5 cm, IgG DCIS, margins negative, 0/4 SLN negative, ER 95%, PR 95%, HER-2/neu negative, Ki-67 10%, T1 N0 stage IA      08/06/2016 - 09/21/2016 Radiation Therapy    Adj XRT [name]), 1. The right breast was treated to 50.4 Gy in 28 fractions at 1.8 Gy per fraction.  2. The right breast was boosted to 10 Gy in 5 fractions at 2 Gy per fraction.       10/05/2016 -  Anti-estrogen oral therapy    Tamoxifen 20 mg daily       CHIEF COMPLIANT: Follow-up on tamoxifen therapy  INTERVAL HISTORY: Dominique Murphy is a  46 year old with above-mentioned history of right breast cancer treated with lumpectomy adjuvant radiation and is currently on tamoxifen. She has been tolerating tamoxifen extremely well. She has no hot flashes at all. She denies any myalgias. She tells me that her gynecologist perform blood work with suggested that she was perimenopausal. She has not had her menstrual cycle since the Mirena was removed.  REVIEW OF SYSTEMS:   Constitutional: Denies fevers, chills or abnormal weight loss Eyes: Denies blurriness of vision Ears, nose, mouth, throat, and face: Denies mucositis or sore throat Respiratory: Denies cough, dyspnea or wheezes Cardiovascular: Denies palpitation, chest discomfort Gastrointestinal:  Denies nausea, heartburn or change in bowel habits Skin: Denies abnormal skin rashes Lymphatics: Denies new lymphadenopathy or easy bruising Neurological:Denies numbness, tingling or new weaknesses Behavioral/Psych: Mood is stable, no new changes  Extremities: No lower extremity edema Breast:  denies any pain or lumps or nodules in either breasts All other systems were reviewed with the patient and are negative.  I have reviewed the past medical history, past surgical history, social history and family history with the patient and they are unchanged from previous note.  ALLERGIES:  is allergic to morphine and related and percocet [oxycodone-acetaminophen].  MEDICATIONS:  Current Outpatient Prescriptions  Medication Sig Dispense Refill  . ALPRAZolam (XANAX) 0.5 MG tablet Take 1 tablet (0.5 mg total) by mouth at bedtime as needed for anxiety. (Patient not taking: Reported on 03/10/2017) 30 tablet 2  . diphenhydrAMINE (BENADRYL)  25 MG tablet Take 25 mg by mouth at bedtime as needed.    . mometasone (NASONEX) 50 MCG/ACT nasal spray Place 1 spray into the nose as directed.    . Multiple Vitamin (MULTIVITAMIN) tablet Take 1 tablet by mouth daily.    . tamoxifen (NOLVADEX) 20 MG tablet Take 1  tablet (20 mg total) by mouth daily. 90 tablet 3  . valACYclovir (VALTREX) 1000 MG tablet Take 2 tabs q 12 hours for first day of outbreak. (Patient not taking: Reported on 03/17/2017) 4 tablet 5   No current facility-administered medications for this visit.     PHYSICAL EXAMINATION: ECOG PERFORMANCE STATUS: 0 - Asymptomatic  Vitals:   06/01/17 1401  BP: 119/77  Pulse: 77  Resp: 16  Temp: 98.4 F (36.9 C)   Filed Weights   06/01/17 1401  Weight: 155 lb 8 oz (70.5 kg)    GENERAL:alert, no distress and comfortable SKIN: skin color, texture, turgor are normal, no rashes or significant lesions EYES: normal, Conjunctiva are pink and non-injected, sclera clear OROPHARYNX:no exudate, no erythema and lips, buccal mucosa, and tongue normal  NECK: supple, thyroid normal size, non-tender, without nodularity LYMPH:  no palpable lymphadenopathy in the cervical, axillary or inguinal LUNGS: clear to auscultation and percussion with normal breathing effort HEART: regular rate & rhythm and no murmurs and no lower extremity edema ABDOMEN:abdomen soft, non-tender and normal bowel sounds MUSCULOSKELETAL:no cyanosis of digits and no clubbing  NEURO: alert & oriented x 3 with fluent speech, no focal motor/sensory deficits EXTREMITIES: No lower extremity edema  LABORATORY DATA:  I have reviewed the data as listed   Chemistry      Component Value Date/Time   NA 138 01/31/2014 1042   K 4.4 01/31/2014 1042   CL 103 01/31/2014 1042   CO2 27 01/31/2014 1042   BUN 18 01/31/2014 1042   CREATININE 0.76 01/31/2014 1042      Component Value Date/Time   CALCIUM 9.2 01/31/2014 1042   ALKPHOS 66 01/31/2014 1042   AST 26 01/31/2014 1042   ALT 19 01/31/2014 1042   BILITOT 0.3 01/31/2014 1042       Lab Results  Component Value Date   WBC 6.3 01/26/2013   HGB 12.6 01/26/2013   HCT 37.6 01/26/2013   MCV 90.8 01/26/2013   PLT 301 01/26/2013   NEUTROABS 3.3 01/26/2013    ASSESSMENT & PLAN:    Breast cancer of upper-outer quadrant of right female breast ([facility]) Right lumpectomy 07/01/2016: IDC grade 3, 0.5 cm, IgG DCIS, margins negative, 0/4 SLN negative, ER 95%, PR 95%, HER-2/neu negative, Ki-67 10%, T1 N0 stage IA Patient had started tamoxifen 20 mg daily 05/19/2016 because surgery was slightly delayed. Oncotype DX score 12, 8% risk of recurrence  Tamoxifen toxicities:Denies any hot flashes or myalgias. Adj XRT 08/06/16 to 09/21/16  Treatment Plan: Adjuvant antiestrogen therapy with tamoxifen for 5-10 years  Tamoxifen toxicities: Denies any hot flashes or myalgias.  Breast cancer surveillance: 1. Breast exam 06/01/2017: Benign 2. mammogram 04/29/2017: Benign breast density category C   I spent 25 minutes talking to the patient of which more than half was spent in counseling and coordination of care.  No orders of the defined types were placed in this encounter.  The patient has a good understanding of the overall plan. she agrees with it. she will call with any problems that may develop before the next visit here.   Rulon Eisenmenger, MD 06/01/17

## 2017-06-15 ENCOUNTER — Encounter: Payer: Self-pay | Admitting: Gynecology

## 2017-06-15 NOTE — Telephone Encounter (Signed)
What labs to order for her. Her appointment is at 3:00pm so I assume she will be coming at a morning time prior to that this week. thanks

## 2017-06-15 NOTE — Telephone Encounter (Signed)
Okay for her to arrange for fasting CBC, comprehensive metabolic panel, lipid profile, FSH and TSH

## 2017-06-18 ENCOUNTER — Encounter: Payer: Self-pay | Admitting: Gynecology

## 2017-06-18 ENCOUNTER — Ambulatory Visit (INDEPENDENT_AMBULATORY_CARE_PROVIDER_SITE_OTHER): Payer: BLUE CROSS/BLUE SHIELD | Admitting: Gynecology

## 2017-06-18 VITALS — BP 124/78 | Ht 66.0 in | Wt 152.0 lb

## 2017-06-18 DIAGNOSIS — Z01419 Encounter for gynecological examination (general) (routine) without abnormal findings: Secondary | ICD-10-CM | POA: Diagnosis not present

## 2017-06-18 DIAGNOSIS — N912 Amenorrhea, unspecified: Secondary | ICD-10-CM

## 2017-06-18 DIAGNOSIS — Z13220 Encounter for screening for lipoid disorders: Secondary | ICD-10-CM

## 2017-06-18 LAB — CBC WITH DIFFERENTIAL/PLATELET
Basophils Absolute: 0 cells/uL (ref 0–200)
Basophils Relative: 0 %
Eosinophils Absolute: 106 cells/uL (ref 15–500)
Eosinophils Relative: 2 %
HCT: 40.7 % (ref 35.0–45.0)
Hemoglobin: 13.3 g/dL (ref 11.7–15.5)
Lymphocytes Relative: 38 %
Lymphs Abs: 2014 cells/uL (ref 850–3900)
MCH: 30.9 pg (ref 27.0–33.0)
MCHC: 32.7 g/dL (ref 32.0–36.0)
MCV: 94.7 fL (ref 80.0–100.0)
MPV: 10.4 fL (ref 7.5–12.5)
Monocytes Absolute: 477 cells/uL (ref 200–950)
Monocytes Relative: 9 %
Neutro Abs: 2703 cells/uL (ref 1500–7800)
Neutrophils Relative %: 51 %
Platelets: 267 10*3/uL (ref 140–400)
RBC: 4.3 MIL/uL (ref 3.80–5.10)
RDW: 13 % (ref 11.0–15.0)
WBC: 5.3 10*3/uL (ref 3.8–10.8)

## 2017-06-18 NOTE — Patient Instructions (Signed)
Followup for exam in one year. 

## 2017-06-18 NOTE — Progress Notes (Signed)
    Dominique Murphy Jul 04, 1971 [id number]        46 y.o.  G1P1001 for annual exam.    Past medical history,surgical history, problem list, medications, allergies, family history and social history were all reviewed and documented as reviewed in the EPIC chart.  ROS:  Performed with pertinent positives and negatives included in the history, assessment and plan.   Additional significant findings :  None   Exam: Dominique Murphy assistant Vitals:   06/18/17 1501  BP: 124/78  Weight: 152 lb (68.9 kg)  Height: 5\' 6"  (1.676 m)   Body mass index is 24.53 kg/m.  General appearance:  Normal affect, orientation and appearance. Skin: Grossly normal HEENT: Without gross lesions.  No cervical or supraclavicular adenopathy. Thyroid normal.  Lungs:  Clear without wheezing, rales or rhonchi Cardiac: RR, without RMG Abdominal:  Soft, nontender, without masses, guarding, rebound, organomegaly or hernia Breasts:  Examined lying and sitting. Left without masses, retractions, discharge or axillary adenopathy.  Right with radiation changes. No masses, retractions, discharge, adenopathy Pelvic:  Ext, BUS, Vagina: Normal  Cervix: Normal  Uterus: Anteverted, normal size, shape and contour, midline and mobile nontender   Adnexa: Without masses or tenderness    Anus and perineum: Normal   Rectovaginal: Normal sphincter tone without palpated masses or tenderness.    Assessment/Plan:  46 y.o. G51P1001 female for annual exam.   1. Postmenopausal. Patient remains without menses. Dominique Murphy was 48 when checked earlier this year. Patient asked if we could recheck it now just to make sure and I ordered an Dominique Murphy and TSH. She was using Mirena IUD previously and was amenorrheic from that. It is unclear when she entered menopause. I did recommend that she continue to use barrier contraception for now through the beginning of next year and if she remains without menses that she clearly would be into menopause. She is not having  significant hot flushes, night sweats or vaginal dryness. 2. Right breast cancer. Actively being followed by oncology. Exam NED. Mammography 04/2017. 3. Pap smear 2017. Pap smear/HPV 2015. No Pap smear done today. History of CIN-1 2014. Plan follow up Pap smear in 2 years which will make it 3 years from her regular Pap smear and 5 from her Pap smear HPV. 4. Colonoscopy 2018. Repeat at their recommended interval. 5. Health maintenance. Patient requests baseline labs. CBC, CMP, lipid profile, TSH, FSH ordered. Follow up in one year, sooner as needed.   Anastasio Auerbach MD, 3:24 PM 06/18/2017

## 2017-06-19 LAB — COMPREHENSIVE METABOLIC PANEL
ALT: 20 U/L (ref 6–29)
AST: 25 U/L (ref 10–35)
Albumin: 4.4 g/dL (ref 3.6–5.1)
Alkaline Phosphatase: 50 U/L (ref 33–115)
BUN: 17 mg/dL (ref 7–25)
CO2: 23 mmol/L (ref 20–31)
Calcium: 9.8 mg/dL (ref 8.6–10.2)
Chloride: 102 mmol/L (ref 98–110)
Creat: 0.86 mg/dL (ref 0.50–1.10)
Glucose, Bld: 84 mg/dL (ref 65–99)
Potassium: 4.3 mmol/L (ref 3.5–5.3)
Sodium: 139 mmol/L (ref 135–146)
Total Bilirubin: 0.7 mg/dL (ref 0.2–1.2)
Total Protein: 7.3 g/dL (ref 6.1–8.1)

## 2017-06-19 LAB — LIPID PANEL
Cholesterol: 210 mg/dL — ABNORMAL HIGH (ref <200)
HDL: 101 mg/dL (ref >50)
LDL Cholesterol: 98 mg/dL (ref <100)
Total CHOL/HDL Ratio: 2.1 Ratio (ref <5.0)
Triglycerides: 57 mg/dL (ref <150)
VLDL: 11 mg/dL (ref <30)

## 2017-06-19 LAB — FOLLICLE STIMULATING HORMONE: FSH: 46.2 m[IU]/mL

## 2017-06-19 LAB — TSH: TSH: 3.38 mIU/L

## 2017-07-13 ENCOUNTER — Telehealth: Payer: Self-pay | Admitting: Neurology

## 2017-07-13 NOTE — Telephone Encounter (Signed)
Referral should come from PCP.

## 2017-07-13 NOTE — Telephone Encounter (Signed)
PT called and asked if Dr Tomi Likens can refer her to Dr Percival Spanish a cardiologist

## 2017-07-13 NOTE — Telephone Encounter (Signed)
Ok to refer, or should she go through her PCP?

## 2017-07-14 NOTE — Telephone Encounter (Signed)
Called Pt to advise cardiology referral shold go through her PCP, LM on VM

## 2017-07-16 ENCOUNTER — Telehealth: Payer: Self-pay

## 2017-07-16 NOTE — Telephone Encounter (Signed)
SENT NOTES TO SCHEDULING 

## 2017-07-16 NOTE — Telephone Encounter (Signed)
Sent notes to scheduling 

## 2017-07-20 ENCOUNTER — Telehealth: Payer: Self-pay | Admitting: Cardiology

## 2017-07-20 NOTE — Telephone Encounter (Signed)
Received records from [facility] for appointment on 08/20/17 with Dr Percival Spanish.  Records put with Dr Hochrein's schedule for 08/20/17.lp

## 2017-08-11 ENCOUNTER — Encounter: Payer: Self-pay | Admitting: Cardiology

## 2017-08-19 NOTE — Progress Notes (Signed)
Cardiology Office Note   Date:  08/22/2017   ID:  Dominique Murphy, DOB 1971-02-07, MRN [id number]  PCP:  Chesley Noon, MD  Cardiologist:   Minus Breeding, MD  Referring:  Chesley Noon, MD  Chief Complaint  Patient presents with  . Tinnitus      History of Present Illness: Dominique Murphy is a 46 y.o. 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  She's had this for years after she started with some kind of ear problem. Hears a swishing in her ear. It is pulsatile and 247. However, she's had point in time where he goes away completely.  However, it eventually comes back.  She cannot time it to anything in particular. She is otherwise able to be active and can do things like playing tennis and not bring on any symptoms. She denies any palpitations, presyncope or syncope. She's had no chest pressure, neck or arm discomfort. She's had no weight gain or edema. She doesn't notice any exacerbation with tamoxifen that she's been taking for a while.  I was able to review extensive imaging. She's had MRA, CTA of her brain. 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 However, there were no other significant abnormalities.  Past Medical History:  Diagnosis Date  . Allergy   . Breast cancer ([place]) 05/11/16   Right Breast   . 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 Personal history of radiation therapy     Past Surgical History:  Procedure Laterality Date  . [place]  2002  . BREAST BIOPSY    . BREAST LUMPECTOMY    . BREAST LUMPECTOMY WITH RADIOACTIVE SEED AND SENTINEL LYMPH NODE BIOPSY Right 07/01/2016   Procedure: RIGHT BREAST LUMPECTOMY WITH RADIOACTIVE SEED X'S 2 AND SENTINEL LYMPH NODE BIOPSY;  Surgeon: Rolm Bookbinder, MD;  Location: [facility];  Service:  General;  Laterality: Right;  . CESAREAN SECTION  2000  . COLONOSCOPY    . COLPOSCOPY    . MIRENA     Inserted 02/20/2014  . WISDOM TOOTH EXTRACTION       Current Outpatient Prescriptions  Medication Sig Dispense Refill  . mometasone (NASONEX) 50 MCG/ACT nasal spray Place 1 spray into the nose as directed.    . Multiple Vitamin (MULTIVITAMIN) tablet Take 1 tablet by mouth daily.    . tamoxifen (NOLVADEX) 20 MG tablet Take 1 tablet (20 mg total) by mouth daily. 90 tablet 3   No current facility-administered medications for this visit.     Allergies:   Morphine and related and Percocet [oxycodone-acetaminophen]    Social History:  The patient  reports that she has never smoked. She has never used smokeless tobacco. She reports that she drinks about 1.2 oz of alcohol per week . She reports that she does not use drugs.   Family History:  The patient's family history includes Colon cancer in her other; Colon cancer (age of onset: 58) in her father; Heart disease in her maternal grandfather and paternal grandfather; Skin cancer (age of onset: 5) in her sister.    ROS:  Please see the history of present illness.   Otherwise, review of systems are positive for none.   All other systems are reviewed and negative.    PHYSICAL EXAM: VS:  BP 124/78  Pulse 66   Ht 5\' 6"  (1.676 m)   Wt 158 lb 9.6 oz (71.9 kg)   BMI 25.60 kg/m  , BMI Body mass index is 25.6 kg/m. GENERAL:  Well appearing HEENT:  Pupils equal round and reactive, fundi not visualized, oral mucosa unremarkable NECK:  No jugular venous distention, waveform within normal limits, carotid upstroke brisk and symmetric, no bruits, no thyromegaly LYMPHATICS:  No cervical, inguinal adenopathy LUNGS:  Clear to auscultation bilaterally BACK:  No CVA tenderness CHEST:  Unremarkable HEART:  PMI not displaced or sustained,S1 and S2 within normal limits, no S3, no S4, no clicks, no rubs, no murmurs ABD:  Flat, positive bowel sounds  normal in frequency in pitch, no bruits, no rebound, no guarding, no midline pulsatile mass, no hepatomegaly, no splenomegaly EXT:  2 plus pulses throughout, no edema, no cyanosis no clubbing SKIN:  No rashes no nodules NEURO:  Cranial nerves II through XII grossly intact, motor grossly intact throughout PSYCH:  Cognitively intact, oriented to person place and time    EKG:  EKG is ordered today. The ekg ordered today demonstrates sinus rhythm, rate 66, axis within normal limits, intervals with limits, no acute ST-T wave changes.   Recent Labs: 06/18/2017: ALT 20; BUN 17; Creat 0.86; Hemoglobin 13.3; Platelets 267; Potassium 4.3; Sodium 139; TSH 3.38    Lipid Panel    Component Value Date/Time   CHOL 210 (H) 06/18/2017 1533   TRIG 57 06/18/2017 1533   HDL 101 06/18/2017 1533   CHOLHDL 2.1 06/18/2017 1533   VLDL 11 06/18/2017 1533   LDLCALC 98 06/18/2017 1533      Wt Readings from Last 3 Encounters:  08/20/17 158 lb 9.6 oz (71.9 kg)  06/18/17 152 lb (68.9 kg)  06/01/17 155 lb 8 oz (70.5 kg)      Other studies Reviewed: Additional studies/ records that were reviewed today include: CTA, MRA. Review of the above records demonstrates:  Please see elsewhere in the note.     ASSESSMENT AND PLAN:    TINNITUS:  Pulsatile tinnitus can be vascular with dural AV fistula or carotid-cavernous sinous fistula.  This would best be evaluated by possible angiography.  She had no evidence of these findings or others on CTA and MRA. I asked her to inquire with her neurologist as to whether invasive angiography would add any incremental benefit to the diagnostic studies already done. At this point however she's had no cardiac symptoms. She has no physical findings of EKG findings that suggest a cardiac etiology. I don't think that further cardiovascular studies would be helpful. She will go back to see her neurologist.  Of note we did look up tamoxifen. It does cause vasodilatation. However,  pulsatile tinnitus has not been reported.  Current medicines are reviewed at length with the patient today.  The patient does not have concerns regarding medicines.  The following changes have been made:  no change  Labs/ tests ordered today include:  No orders of the defined types were placed in this encounter.    Disposition:   FU with me as needed.     Signed, Minus Breeding, MD  08/22/2017 11:45 AM    [place]

## 2017-08-20 ENCOUNTER — Ambulatory Visit (INDEPENDENT_AMBULATORY_CARE_PROVIDER_SITE_OTHER): Payer: BLUE CROSS/BLUE SHIELD | Admitting: Cardiology

## 2017-08-20 ENCOUNTER — Encounter: Payer: Self-pay | Admitting: Cardiology

## 2017-08-20 VITALS — BP 124/78 | HR 66 | Ht 66.0 in | Wt 158.6 lb

## 2017-08-20 DIAGNOSIS — H93A9 Pulsatile tinnitus, unspecified ear: Secondary | ICD-10-CM | POA: Diagnosis not present

## 2017-08-20 NOTE — Patient Instructions (Signed)
Your physician recommends that you schedule a follow-up appointment in: As Needed    

## 2017-08-22 ENCOUNTER — Encounter: Payer: Self-pay | Admitting: Cardiology

## 2017-08-27 ENCOUNTER — Encounter: Payer: Self-pay | Admitting: Cardiology

## 2017-10-25 ENCOUNTER — Encounter: Payer: Self-pay | Admitting: Hematology and Oncology

## 2017-10-25 ENCOUNTER — Encounter: Payer: Self-pay | Admitting: Gynecology

## 2017-10-26 NOTE — Telephone Encounter (Signed)
Tell patient I think that is a question better answered by her oncologist who deals with this every day.

## 2017-12-15 ENCOUNTER — Other Ambulatory Visit: Payer: Self-pay | Admitting: Hematology and Oncology

## 2017-12-15 DIAGNOSIS — Z853 Personal history of malignant neoplasm of breast: Secondary | ICD-10-CM

## 2017-12-16 ENCOUNTER — Other Ambulatory Visit: Payer: Self-pay

## 2017-12-16 ENCOUNTER — Telehealth: Payer: Self-pay

## 2017-12-16 DIAGNOSIS — Z170 Estrogen receptor positive status [ER+]: Secondary | ICD-10-CM

## 2017-12-16 DIAGNOSIS — C50411 Malignant neoplasm of upper-outer quadrant of right female breast: Secondary | ICD-10-CM

## 2017-12-16 NOTE — Telephone Encounter (Signed)
Called pt to inform her that per Dr. Lindi Adie she is to have a diagnostic mammogram on the R for the area of increased soreness and pain she is having. Orders placed. She should be called to schedule.  Cyndia Bent RN

## 2017-12-27 ENCOUNTER — Other Ambulatory Visit: Payer: Self-pay | Admitting: Hematology and Oncology

## 2017-12-27 DIAGNOSIS — Z170 Estrogen receptor positive status [ER+]: Secondary | ICD-10-CM

## 2017-12-27 DIAGNOSIS — C50411 Malignant neoplasm of upper-outer quadrant of right female breast: Secondary | ICD-10-CM

## 2017-12-28 ENCOUNTER — Encounter: Payer: Self-pay | Admitting: Gynecology

## 2017-12-28 ENCOUNTER — Ambulatory Visit: Payer: BLUE CROSS/BLUE SHIELD | Admitting: Gynecology

## 2017-12-28 VITALS — BP 118/74

## 2017-12-28 DIAGNOSIS — N951 Menopausal and female climacteric states: Secondary | ICD-10-CM

## 2017-12-28 DIAGNOSIS — G4700 Insomnia, unspecified: Secondary | ICD-10-CM | POA: Diagnosis not present

## 2017-12-28 DIAGNOSIS — R635 Abnormal weight gain: Secondary | ICD-10-CM | POA: Diagnosis not present

## 2017-12-28 MED ORDER — ALPRAZOLAM 0.25 MG PO TABS
ORAL_TABLET | ORAL | 3 refills | Status: DC
Start: 2017-12-28 — End: 2018-10-24

## 2017-12-28 NOTE — Progress Notes (Signed)
    Dominique Murphy [birth year] [id number]        47 y.o.  G1P1001 presents to discuss menopausal symptoms.  Patient notes is becoming increasingly difficult to maintain her weight.  She underwent a 40 pound weight loss previously and notes that she is starting to put weight back on despite eating the same and exercising more.  Not having significant hot flushes or night sweats.  Has elevated FSH and no menses.  Being followed for breast cancer on tamoxifen.  No skin or hair changes.  No nausea vomiting diarrhea constipation.  Also notes having issues with sleeping where she will wake up in the middle the night and have trouble falling back asleep.  Not having sweats per se but feels like her mind is racing.  Past medical history,surgical history, problem list, medications, allergies, family history and social history were all reviewed and documented in the EPIC chart.  Directed ROS with pertinent positives and negatives documented in the history of present illness/assessment and plan.  Exam: Vitals:   12/28/17 0839  BP: 118/74   General appearance:  Normal HEENT normal without evidence of thyromegaly  Assessment/Plan:  47 y.o. G1P1001 with perimenopausal weight gain.  No symptoms to suggest thyroid dysfunction or other abnormalities.  Also having issues with insomnia.  The patient and I reviewed the whole perimenopausal timeframe.  She is not having overt hot flushes or sweats but I suspect her other symptomatology is related to her perimenopause.  She has tried over-the-counter sleep aids which do not seem to help.  We reviewed various options recognizing HRT is contraindicated with her breast cancer.  After lengthy discussion of the various options will try Xanax 0.25 mg at bedtime to see if this does not relax her to allow her to sleep better.  #30 with 3 refills provided.  She will let me know if she has any issues with this.  We also discussed the issues of weight control.  Of asked her to go  back and re-analyze her diet to make sure there is no hidden areas of calories she is not accounting for.  We also discussed it is not an unusual time to have some weight gain as she goes through menopause.  We will go ahead and check thyroid and comprehensive metabolic panel as a baseline.  She will follow-up with her primary physician if weight gain continues to be an issue for their evaluation.  Greater than 50% of my time was spent in direct face to face counseling and coordination of care with the patient.      Anastasio Auerbach MD, 9:08 AM 12/28/2017

## 2017-12-28 NOTE — Patient Instructions (Signed)
Office will let you know your lab test results.

## 2017-12-29 ENCOUNTER — Other Ambulatory Visit: Payer: Self-pay | Admitting: Gynecology

## 2017-12-29 ENCOUNTER — Other Ambulatory Visit: Payer: Self-pay | Admitting: *Deleted

## 2017-12-29 ENCOUNTER — Other Ambulatory Visit: Payer: BLUE CROSS/BLUE SHIELD

## 2017-12-29 DIAGNOSIS — R7989 Other specified abnormal findings of blood chemistry: Secondary | ICD-10-CM

## 2017-12-29 LAB — TSH: TSH: 10.14 mIU/L — ABNORMAL HIGH

## 2017-12-29 LAB — COMPREHENSIVE METABOLIC PANEL
AG Ratio: 1.7 (calc) (ref 1.0–2.5)
ALT: 29 U/L (ref 6–29)
AST: 29 U/L (ref 10–35)
Albumin: 4.5 g/dL (ref 3.6–5.1)
Alkaline phosphatase (APISO): 51 U/L (ref 33–115)
BUN: 16 mg/dL (ref 7–25)
CO2: 27 mmol/L (ref 20–32)
Calcium: 9.8 mg/dL (ref 8.6–10.2)
Chloride: 107 mmol/L (ref 98–110)
Creat: 0.8 mg/dL (ref 0.50–1.10)
Globulin: 2.7 g/dL (calc) (ref 1.9–3.7)
Glucose, Bld: 97 mg/dL (ref 65–99)
Potassium: 4.3 mmol/L (ref 3.5–5.3)
Sodium: 139 mmol/L (ref 135–146)
Total Bilirubin: 0.4 mg/dL (ref 0.2–1.2)
Total Protein: 7.2 g/dL (ref 6.1–8.1)

## 2017-12-29 LAB — THYROID PANEL WITH TSH
Free Thyroxine Index: 1.7 (ref 1.4–3.8)
T3 Uptake: 31 % (ref 22–35)
T4, Total: 5.6 ug/dL (ref 5.1–11.9)
TSH: 6.55 mIU/L — ABNORMAL HIGH

## 2017-12-29 NOTE — Addendum Note (Signed)
Addended by: Joaquin Music on: 12/29/2017 09:52 AM   Modules accepted: Orders

## 2018-01-11 ENCOUNTER — Ambulatory Visit
Admission: RE | Admit: 2018-01-11 | Discharge: 2018-01-11 | Disposition: A | Discharge status: Home / Self Care | Payer: BLUE CROSS/BLUE SHIELD | Source: Ambulatory Visit | Attending: Hematology and Oncology | Admitting: Hematology and Oncology

## 2018-01-11 DIAGNOSIS — C50411 Malignant neoplasm of upper-outer quadrant of right female breast: Secondary | ICD-10-CM

## 2018-01-11 DIAGNOSIS — Z170 Estrogen receptor positive status [ER+]: Secondary | ICD-10-CM

## 2018-01-12 ENCOUNTER — Ambulatory Visit: Payer: BLUE CROSS/BLUE SHIELD | Admitting: Gynecology

## 2018-01-12 ENCOUNTER — Encounter: Payer: Self-pay | Admitting: Gynecology

## 2018-01-12 VITALS — BP 114/74

## 2018-01-12 DIAGNOSIS — E039 Hypothyroidism, unspecified: Secondary | ICD-10-CM

## 2018-01-12 MED ORDER — LEVOTHYROXINE SODIUM 50 MCG PO TABS: 50.0000 ug | ORAL_TABLET | Freq: Every day | ORAL | 2 refills | Status: DC

## 2018-01-12 NOTE — Patient Instructions (Signed)
Start on the Synthroid thyroid replacement.  Call me if you have any issues with this.  Repeat your blood test in 2 months.

## 2018-01-12 NOTE — Progress Notes (Signed)
    LENORA GOMES October 07, 1971 [id number]        47 y.o.  G1P1001 presents to discuss her elevated TSH.  Patient was recently seen for issues with weight gain that she attributed to the menopause.  She was not having significant hot flushes or sweats.  Her [place] was elevated in the past and she is no longer having menses.  Currently being treated for breast cancer with tamoxifen.  No skin or hair changes.  No nausea vomiting diarrhea constipation.  Also having some issues of sleeping.  Had TSH checked which was 10.  Had a repeat thyroid profile which was normal with the exception of her TSH was 6.5.  Past medical history,surgical history, problem list, medications, allergies, family history and social history were all reviewed and documented in the EPIC chart.  Directed ROS with pertinent positives and negatives documented in the history of present illness/assessment and plan.  Exam: Vitals:   01/12/18 1624  BP: 114/74  Pulse: 76 General appearance:  Normal HEENT normal without evidence of thyromegaly Lungs clear Cardiac regular rate without rubs murmurs or gallops  Assessment/Plan:  47 y.o. G1P1001 with symptoms and elevated TSH to suggest hypothyroidism.  Exam is normal without evidence of thyromegaly, nodularity, exophthalmos.  I reviewed with the patient the diagnosis of hypothyroidism.  Possibilities to include primary hypothyroidism versus autoimmune phenomena such as Graves' disease or Hashimoto's.  Options for management to include starting on thyroid replacement versus referral to endocrinology discussed.  At this point since everything appears to be straightforward hypothyroidism will start on Synthroid 50 mcg daily.  The signs and symptoms of overmedication discussed.  We will recheck a TSH in 2 months and see how she is doing from a symptom standpoint.  We will then discuss possible dosage adjustment pending these results.  Patient is comfortable with this approach.  Call me if she has any  issues after starting the thyroid replacement.  Greater than 50% of my time was spent in direct face to face counseling and coordination of care with the patient.     Anastasio Auerbach MD, 4:46 PM 01/12/2018

## 2018-01-14 ENCOUNTER — Encounter: Payer: Self-pay | Admitting: Gynecology

## 2018-01-14 NOTE — Telephone Encounter (Signed)
Actually I did touch on this when she came in for discussion.  I reviewed possibilities for Hashimoto's or Graves' disease.  This usually manifests with enlarged thyroid and hyperthyroidism initially.  After it runs its course it can turn into hypothyroidism.  But in the absence of hyperthyroidism usually there is no active treatment other than thyroid replacement.  So given her straightforward presentation and lab findings did not feel further evaluation for thyroiditis needed.

## 2018-01-27 ENCOUNTER — Encounter: Payer: Self-pay | Admitting: Gynecology

## 2018-01-28 ENCOUNTER — Other Ambulatory Visit: Payer: Self-pay | Admitting: Gynecology

## 2018-01-28 MED ORDER — SYNTHROID 75 MCG PO TABS: 75.0000 ug | ORAL_TABLET | Freq: Every day | ORAL | 1 refills | Status: DC

## 2018-01-28 NOTE — Telephone Encounter (Signed)
Dr. TF-Do you want to hold off recheck TSH level now. (You had recommended rechecking after 2 mos on med)

## 2018-01-28 NOTE — Telephone Encounter (Signed)
Let Dominique Murphy try increasing her Synthroid to 75 mcg #30 1 refill with follow-up phone call in 1 month to see how she is doing

## 2018-01-28 NOTE — Telephone Encounter (Signed)
Okay to recheck after 1 month of being on the new dose of the medication

## 2018-02-05 ENCOUNTER — Other Ambulatory Visit: Payer: Self-pay | Admitting: Gynecology

## 2018-02-07 ENCOUNTER — Telehealth: Payer: Self-pay | Admitting: *Deleted

## 2018-02-07 ENCOUNTER — Encounter: Payer: Self-pay | Admitting: Gynecology

## 2018-02-07 NOTE — Telephone Encounter (Signed)
Okay for referral?

## 2018-02-07 NOTE — Telephone Encounter (Signed)
Patient sent my chart message requesting to be referred to Dr. Chalmers Cater and Dr. Phineas Real said okay for hypothyroid, notes faxed they will contact pt to schedule and fax me back with time and date. Pt aware of this as well via my chart.

## 2018-02-09 IMAGING — MR MR BREAST BILAT WO/W CM
7 of 11 series · 32 of 48 positions shown · IV contrast (15ml multihance)
Comparison: No prior MRI available for comparison.

CLINICAL DATA: 45-year-old female recently diagnosed with grade 2
invasive ductal carcinoma of the right breast at [DATE], 5 cm from the
nipple. A second biopsy at [DATE] in the right breast at 7 cm from the
nipple demonstrated fibrocystic change with calcifications, thought
to be discordant with the ultrasound findings. A right axillary
lymph node was biopsied and benign by pathology.

LABS:  Not applicable.
EXAM:
BILATERAL BREAST MRI WITH AND WITHOUT CONTRAST
TECHNIQUE: Multiplanar, multisequence MR images of both breasts were obtained
prior to and following the intravenous administration of MultiHance.
The patient was asked to return for additional images for technical
purposes, so on 2 different occasions the patient was given
MultiHance. She was given 18 mL of MultiHance on 05/20/2016, and a
another 15 mL of MultiHance on her return visit 05/26/2016.

[Series 2: fl3d pre-cm no · axial · non-contrast · 1.2mm · 0.94mm/px · z∈[-64,+108]mm · 5 of 144 slices shown]
[im 1/144]
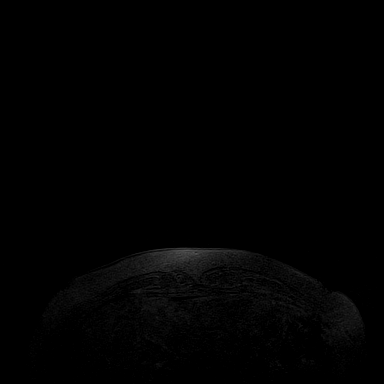
[im 36/144]
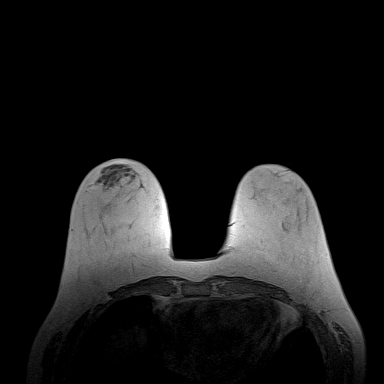
[im 72/144]
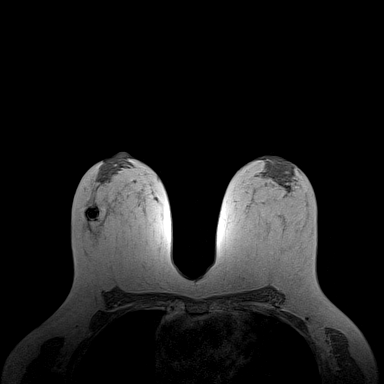
[im 108/144]
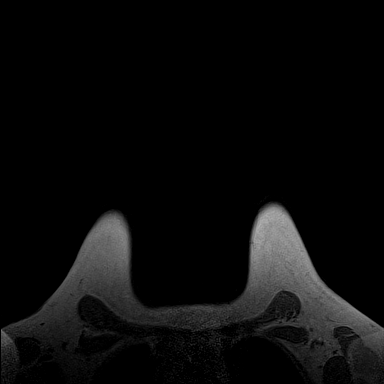
[im 144/144]
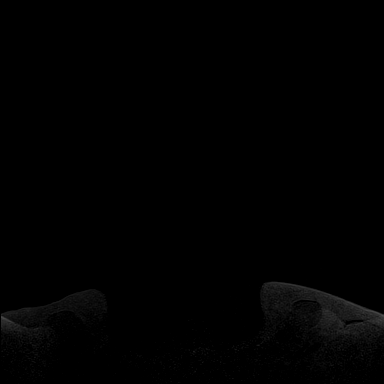

[Series 3: fl3d pre-cm · axial · non-contrast · 1.2mm · 0.94mm/px · z∈[-64,+108]mm · 5 of 144 slices shown]
[im 1/144]
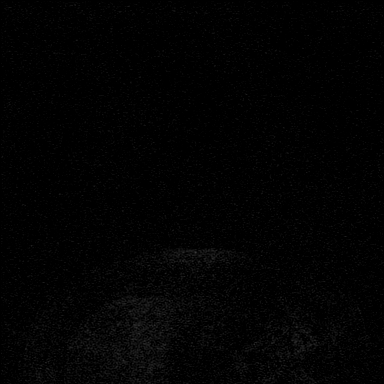
[im 36/144]
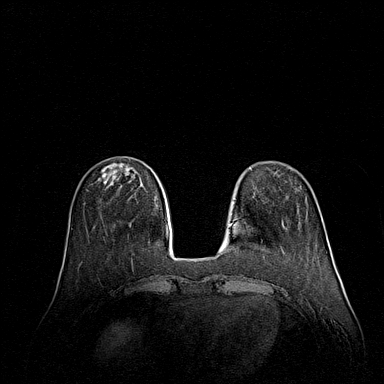
[im 72/144]
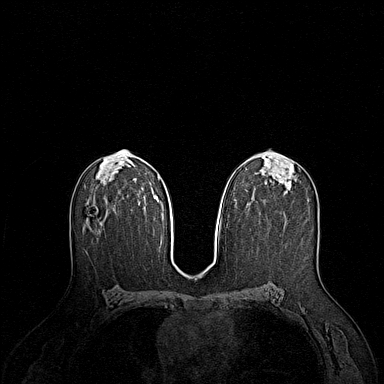
[im 108/144]
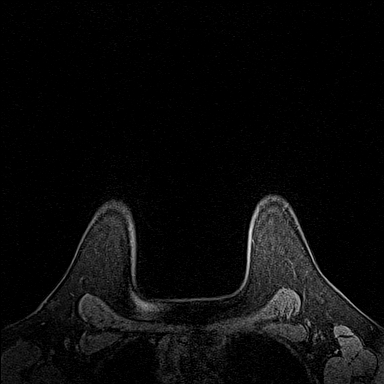
[im 144/144]
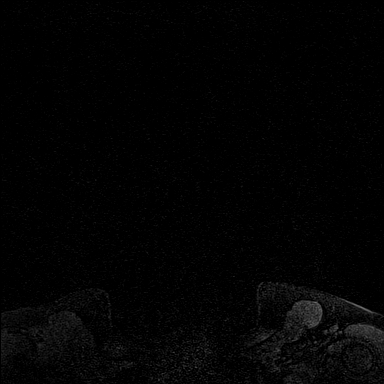

[Series 4: fl3d post immediate · axial · 1.2mm · 0.94mm/px · z∈[-64,+108]mm · 5 of 144 slices shown (1 of 3)]
[im 1/144]
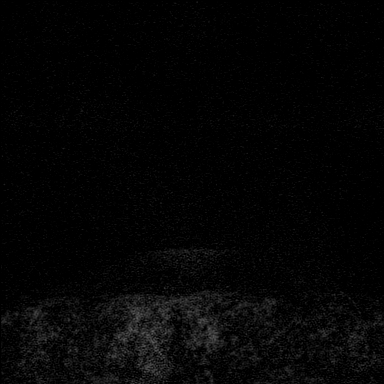
[im 36/144]
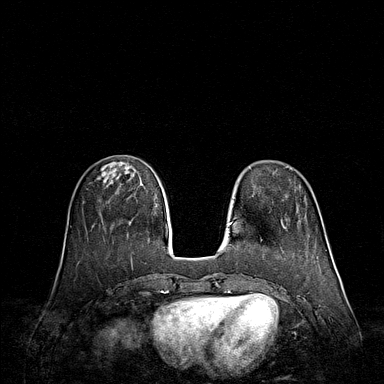
[im 72/144]
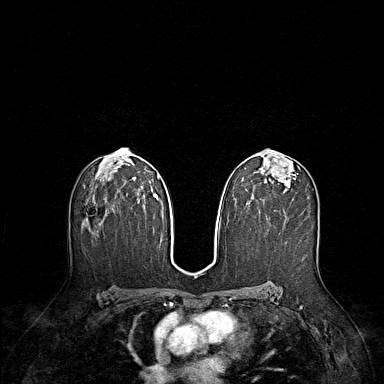
[im 108/144]
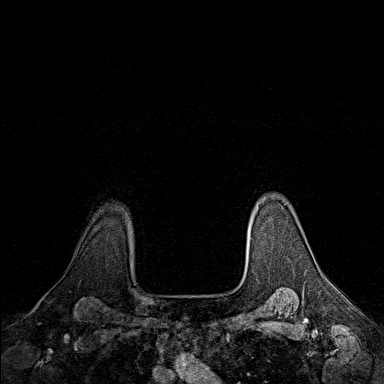
[im 144/144]
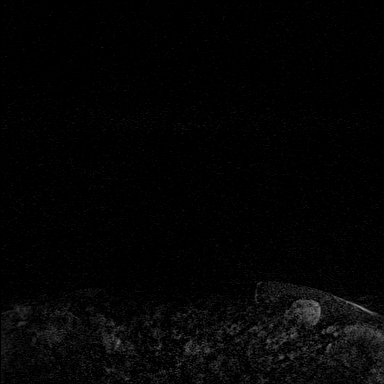

[Series 5: fl3d post immediate · axial · 1.2mm · 0.94mm/px · z∈[-64,+108]mm · 6 of 144 slices shown (2 of 3)]
[im 1/144]
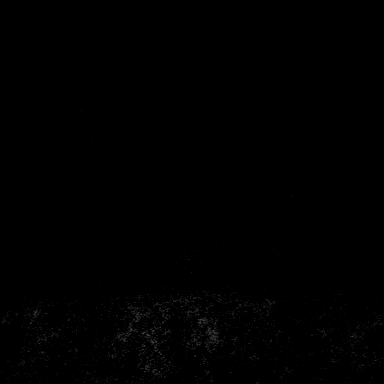
[im 29/144]
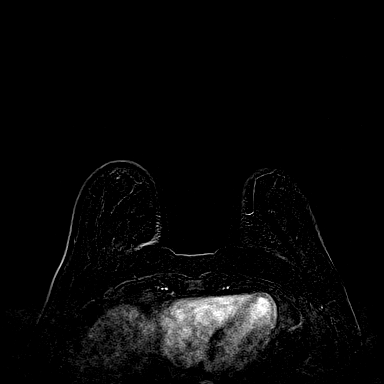
[im 58/144]
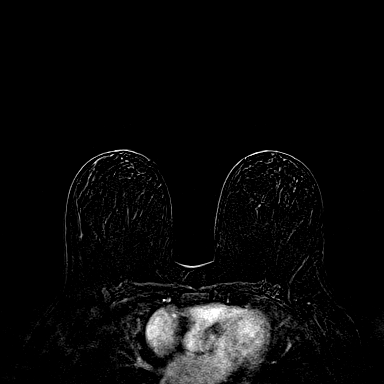
[im 86/144]
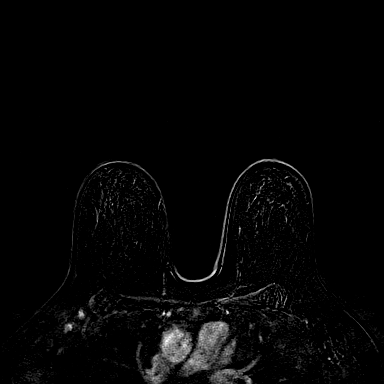
[im 115/144]
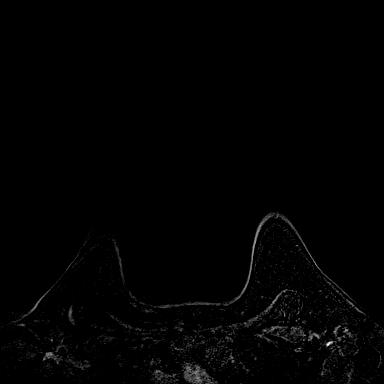
[im 144/144]
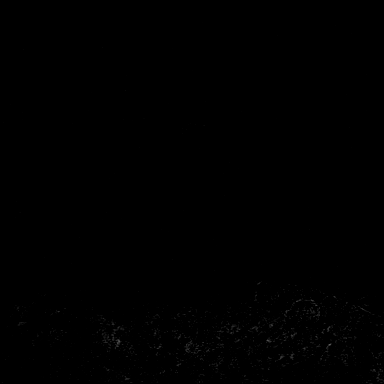

[Series 6: fl3d post immediate · axial · 172.8mm · 0.94mm/px · 1 of 1 slices shown (3 of 3)]
[im 1/1]
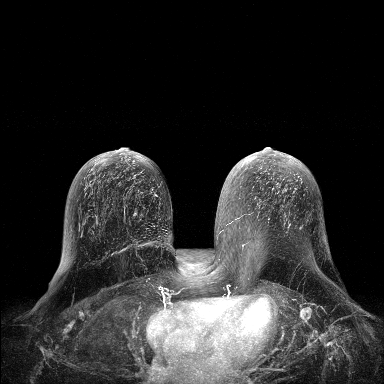

[Series 7: fl3d post 3min · axial · 1.2mm · 0.94mm/px · z∈[-64,+108]mm · 6 of 144 slices shown]
[im 1/144]
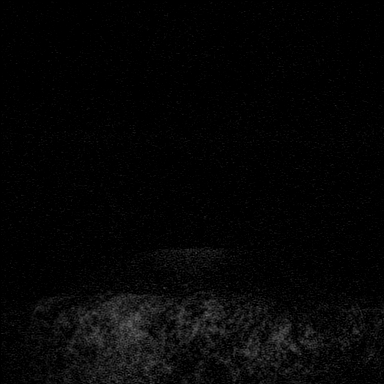
[im 29/144]
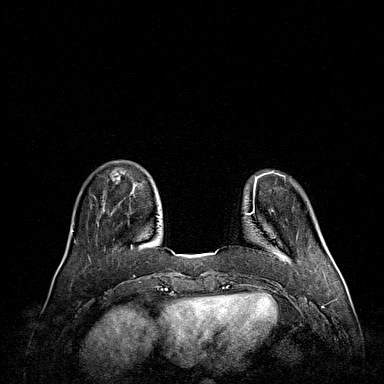
[im 58/144]
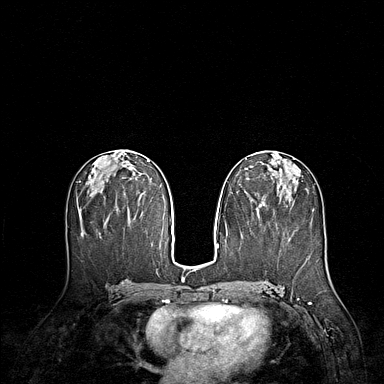
[im 86/144]
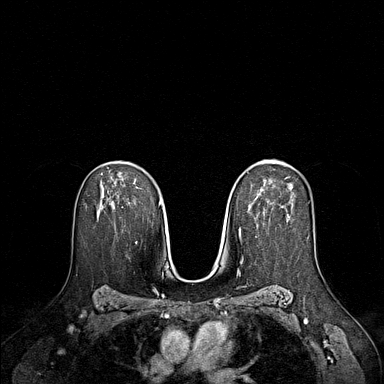
[im 115/144]
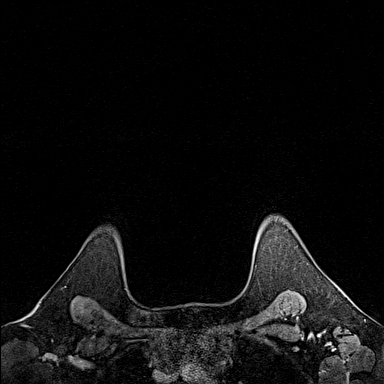
[im 144/144]
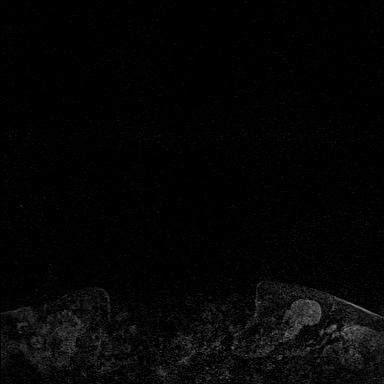

[Series 8: fl3d post 3min_sub · axial · 1.2mm · 0.94mm/px · z∈[-64,+38]mm · 4 of 144 slices shown]
[im 1/144]
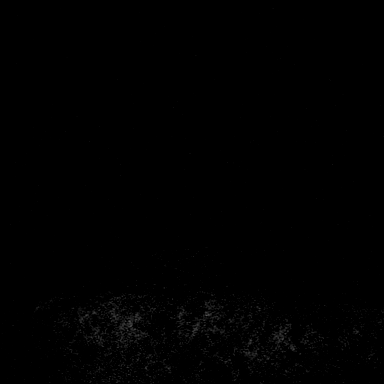
[im 29/144]
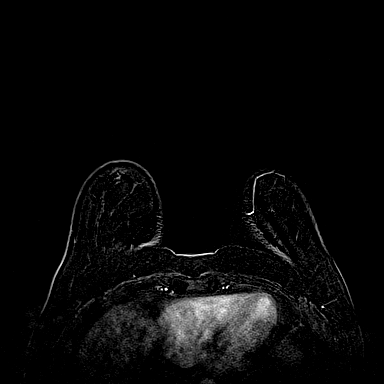
[im 58/144]
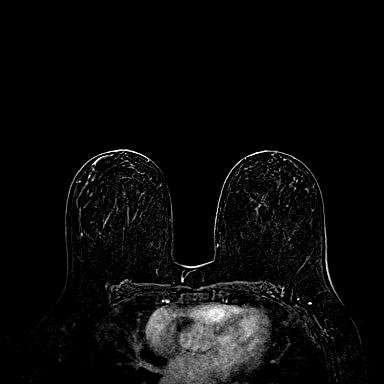
[im 86/144]
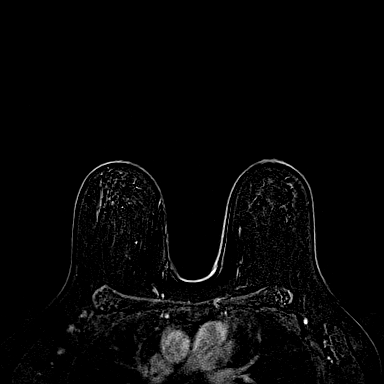

[32 of 48 positions shown; findings below may reference images not displayed]

THREE-DIMENSIONAL MR IMAGE RENDERING ON INDEPENDENT WORKSTATION:

Three-dimensional MR images were rendered by post-processing of the
original MR data on an independent workstation. The
three-dimensional MR images were interpreted, and findings are
reported in the following complete MRI report for this study. Three
dimensional images were evaluated at the independent [employer]
workstation
FINDINGS: Breast composition: c. Heterogeneous fibroglandular tissue.

Background parenchymal enhancement: Mild.

Right breast: Susceptibility artifact from the biopsy marking clip
at the site of cancer is seen in the lateral right breast middle
depth (series 5, image 66). Susceptibility from the second biopsy
marking clip at the benign biopsy in the lateral right breast
approximately 2 cm posterior to the known cancer (series 5, image
71). There is no significant enhancement at either site with the
biopsy marking clips.

Left breast: No mass or abnormal enhancement.

Lymph nodes: No abnormal appearing lymph nodes. There is subtle
susceptibility at the site of the HydroMARK biopsy marking clip in
the right axilla at the site of the benign lymph node.

Ancillary findings:  None.
IMPRESSION: 1. A biopsy marking clip without enhancement is identified at the
site of known cancer in the lateral right breast.

2. There is no significant enhancement at the site of the biopsy
marking clip from the benign biopsy in the lateral right breast.

3. No additional sites of malignancy are identified in the right
breast, or in the left breast.

RECOMMENDATION:
Continue treatment plan.

BI-RADS CATEGORY  6: Known biopsy-proven malignancy.

## 2018-02-17 ENCOUNTER — Encounter: Payer: Self-pay | Admitting: *Deleted

## 2018-02-17 NOTE — Telephone Encounter (Signed)
Pt scheduled on 03/03/18 @ 10:00am with Dr.Balan

## 2018-02-23 ENCOUNTER — Other Ambulatory Visit: Payer: BLUE CROSS/BLUE SHIELD

## 2018-02-23 DIAGNOSIS — E039 Hypothyroidism, unspecified: Secondary | ICD-10-CM

## 2018-02-23 LAB — TSH: TSH: 2.12 mIU/L

## 2018-02-24 ENCOUNTER — Encounter: Payer: Self-pay | Admitting: Gynecology

## 2018-03-02 ENCOUNTER — Encounter: Payer: Self-pay | Admitting: Gynecology

## 2018-03-02 MED ORDER — VALACYCLOVIR HCL 1 G PO TABS: ORAL_TABLET | ORAL | 2 refills | Status: AC

## 2018-03-02 NOTE — Telephone Encounter (Signed)
Okay for Valtrex 1 g tab.  Take 2 p.o. at onset of outbreak repeat in 12 hours.  #20 with 2 refills

## 2018-03-03 ENCOUNTER — Other Ambulatory Visit: Payer: BLUE CROSS/BLUE SHIELD

## 2018-03-23 ENCOUNTER — Other Ambulatory Visit: Payer: Self-pay | Admitting: Gynecology

## 2018-04-19 ENCOUNTER — Telehealth: Payer: Self-pay | Admitting: Hematology and Oncology

## 2018-04-19 NOTE — Telephone Encounter (Signed)
Spoke w/ pt.  Rescheduled appt from 7/17 to 7/29 @ 2:15.  Pt my chart active.

## 2018-05-03 ENCOUNTER — Ambulatory Visit
Admission: RE | Admit: 2018-05-03 | Discharge: 2018-05-03 | Disposition: A | Discharge status: Home / Self Care | Payer: BLUE CROSS/BLUE SHIELD | Source: Ambulatory Visit | Attending: Hematology and Oncology | Admitting: Hematology and Oncology

## 2018-05-03 DIAGNOSIS — Z853 Personal history of malignant neoplasm of breast: Secondary | ICD-10-CM

## 2018-06-01 ENCOUNTER — Ambulatory Visit: Payer: BLUE CROSS/BLUE SHIELD | Admitting: Hematology and Oncology

## 2018-06-13 ENCOUNTER — Inpatient Hospital Stay: Payer: BLUE CROSS/BLUE SHIELD | Attending: Hematology and Oncology | Admitting: Hematology and Oncology

## 2018-06-13 DIAGNOSIS — C50411 Malignant neoplasm of upper-outer quadrant of right female breast: Secondary | ICD-10-CM | POA: Diagnosis present

## 2018-06-13 DIAGNOSIS — Z79810 Long term (current) use of selective estrogen receptor modulators (SERMs): Secondary | ICD-10-CM | POA: Insufficient documentation

## 2018-06-13 DIAGNOSIS — E039 Hypothyroidism, unspecified: Secondary | ICD-10-CM | POA: Insufficient documentation

## 2018-06-13 DIAGNOSIS — Z800 Family history of malignant neoplasm of digestive organs: Secondary | ICD-10-CM | POA: Insufficient documentation

## 2018-06-13 DIAGNOSIS — Z170 Estrogen receptor positive status [ER+]: Secondary | ICD-10-CM

## 2018-06-13 DIAGNOSIS — Z923 Personal history of irradiation: Secondary | ICD-10-CM | POA: Diagnosis not present

## 2018-06-13 MED ORDER — TAMOXIFEN CITRATE 20 MG PO TABS: 20.0000 mg | ORAL_TABLET | Freq: Every day | ORAL | 3 refills | Status: DC

## 2018-06-13 NOTE — Assessment & Plan Note (Signed)
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 Oncotype DX score 12, 8% risk of recurrence  Tamoxifen toxicities:Denies any hot flashes or myalgias. Adj XRT 08/06/16 to 09/21/16  Treatment Plan: Adjuvant antiestrogen therapy with tamoxifen for 10 years  Tamoxifen toxicities: Denies any hot flashes or myalgias.  Breast cancer surveillance: 1. Breast exam 06/13/2018: Benign 2. mammogram 05/03/2018: Benign breast density category C  Return to clinic in 1 year for follow-up

## 2018-06-13 NOTE — Progress Notes (Signed)
Patient Care Team: Chesley Noon, MD as PCP - General (Family Medicine) Rolm Bookbinder, MD as Consulting Physician (General Surgery) Nicholas Lose, MD as Consulting Physician (Hematology and Oncology) Delice Bison Charlestine Massed, NP as Nurse Practitioner (Hematology and Oncology) Kyung Rudd, MD as Consulting Physician (Radiation Oncology)  DIAGNOSIS:  Encounter Diagnosis  Name Primary?  . 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  Genetic testing did not reveal a deleterious mutation.  Genes tested include: APC, ATM, AXIN2, BARD1, BMPR1A, BRCA1, BRCA2, BRIP1, CDH1, CDKN2A, CHEK2, DICER1, EPCAM, GREM1, KIT, MEN1, MLH1, MSH2, MSH6, MUTYH, NBN, NF1, PALB2, PDGFRA, PMS2, POLD1, POLE, PTEN, RAD50, RAD51C, RAD51D, SDHA, SDHB, SDHC, SDHD, SMAD4, SMARCA4. STK11, TP53, TSC1, TSC2, and VHL.      07/01/2016 Surgery    Right lumpectomy: IDC grade 3, 0.5 cm, IgG DCIS, margins negative, 0/4 SLN negative, ER 95%, PR 95%, HER-2/neu negative, Ki-67 10%, T1 N0 stage IA      08/06/2016 - 09/21/2016 Radiation Therapy    Adj XRT [name]), 1. The right breast was treated to 50.4 Gy in 28 fractions at 1.8 Gy per fraction.  2. The right breast was boosted to 10 Gy in 5 fractions at 2 Gy per fraction.       10/05/2016 -  Anti-estrogen oral therapy    Tamoxifen 20 mg daily       CHIEF COMPLIANT: Follow-up on tamoxifen therapy  INTERVAL HISTORY: Dominique Murphy is a  47 year old with above-mentioned history of right breast cancer treated with lumpectomy radiation is currently on tamoxifen therapy.  She has no side effects from tamoxifen.  Denies any lumps or nodules in the breast.  Denies any hot flashes or myalgias to tamoxifen.  Mammograms in June were normal.  REVIEW OF SYSTEMS:   Constitutional: Denies fevers, chills or abnormal weight loss Eyes: Denies blurriness of vision Ears, nose, mouth, throat, and face: Denies mucositis or sore throat Respiratory: Denies cough, dyspnea or wheezes Cardiovascular: Denies palpitation, chest discomfort Gastrointestinal:  Denies nausea, heartburn or change in bowel habits Skin: Denies abnormal skin rashes Lymphatics: Denies new lymphadenopathy or easy bruising Neurological:Denies numbness, tingling or new weaknesses Behavioral/Psych: Mood is stable, no new changes  Extremities: No lower extremity edema Breast:  denies any pain or lumps or nodules in either breasts All other systems were reviewed with the patient and are negative.  I have reviewed the past medical history, past surgical history, social history and family history with the patient and they are unchanged from previous note.  ALLERGIES:  is allergic to morphine and related and percocet [oxycodone-acetaminophen].  MEDICATIONS:  Current Outpatient Medications  Medication Sig Dispense Refill  . ALPRAZolam (XANAX) 0.25 MG tablet 1 tablet at bedtime as needed for sleep 30 tablet 3  . Multiple Vitamin (MULTIVITAMIN) tablet Take 1 tablet by mouth daily.    Marland Kitchen SYNTHROID 75 MCG tablet TAKE 1 TABLET (75 MCG TOTAL) BY MOUTH DAILY BEFORE  BREAKFAST. 30 tablet 1  . tamoxifen (NOLVADEX) 20 MG tablet Take 1 tablet (20 mg total) by mouth daily. 90 tablet 3  . valACYclovir (VALTREX) 1000 MG tablet Take 2 pills at onset of outbreak and repeat in 12 hours 20 tablet 2   No current facility-administered medications for this visit.     PHYSICAL EXAMINATION: ECOG  PERFORMANCE STATUS: 0 - Asymptomatic  Vitals:   06/13/18 1434  BP: 118/84  Pulse: 87  Resp: 19  Temp: 98.4 F (36.9 C)  SpO2: 100%   Filed Weights   06/13/18 1434  Weight: 158 lb 3.2 oz (71.8 kg)    GENERAL:alert, no distress and comfortable SKIN: skin color, texture, turgor are normal, no rashes or significant lesions EYES: normal, Conjunctiva are pink and non-injected, sclera clear OROPHARYNX:no exudate, no erythema and lips, buccal mucosa, and tongue normal  NECK: supple, thyroid normal size, non-tender, without nodularity LYMPH:  no palpable lymphadenopathy in the cervical, axillary or inguinal LUNGS: clear to auscultation and percussion with normal breathing effort HEART: regular rate & rhythm and no murmurs and no lower extremity edema ABDOMEN:abdomen soft, non-tender and normal bowel sounds MUSCULOSKELETAL:no cyanosis of digits and no clubbing  NEURO: alert & oriented x 3 with fluent speech, no focal motor/sensory deficits EXTREMITIES: No lower extremity edema   LABORATORY DATA:  I have reviewed the data as listed CMP Latest Ref Rng & Units 12/28/2017 06/18/2017 01/31/2014  Glucose 65 - 99 mg/dL 97 84 94  BUN 7 - 25 mg/dL 16 17 18   Creatinine 0.50 - 1.10 mg/dL 0.80 0.86 0.76  Sodium 135 - 146 mmol/L 139 139 138  Potassium 3.5 - 5.3 mmol/L 4.3 4.3 4.4  Chloride 98 - 110 mmol/L 107 102 103  CO2 20 - 32 mmol/L 27 23 27   Calcium 8.6 - 10.2 mg/dL 9.8 9.8 9.2  Total Protein 6.1 - 8.1 g/dL 7.2 7.3 7.0  Total Bilirubin 0.2 - 1.2 mg/dL 0.4 0.7 0.3  Alkaline Phos 33 - 115 U/L - 50 66  AST 10 - 35 U/L 29 25 26   ALT 6 - 29 U/L 29 20 19     Lab Results  Component Value Date   WBC 5.3 06/18/2017   HGB 13.3 06/18/2017   HCT 40.7 06/18/2017   MCV 94.7 06/18/2017   PLT 267 06/18/2017   NEUTROABS 2,703 06/18/2017    ASSESSMENT & PLAN:  Breast cancer of upper-outer quadrant of right female breast (HCC) Right lumpectomy 07/01/2016: IDC grade 3, 0.5 cm, IgG DCIS, margins  negative, 0/4 SLN negative, ER 95%, PR 95%, HER-2/neu negative, Ki-67 10%, T1 N0 stage IA Patient had started tamoxifen 20 mg daily 05/19/2016 because surgery was slightly delayed. Oncotype DX score 12, 8% risk of recurrence  Tamoxifen toxicities:Denies any hot flashes or myalgias. Adj XRT 08/06/16 to 09/21/16  Treatment Plan: Adjuvant antiestrogen therapy with tamoxifen for 10 years  Tamoxifen toxicities: Denies any hot flashes or myalgias.  Breast cancer surveillance: 1. Breast exam has been done by her gynecologist. 2. mammogram  05/03/2018: Benign breast density category C  Return to clinic in 1 year for follow-up   No orders of the defined types were placed in this encounter.  The patient has a good understanding of the overall plan. she agrees with it. she will call with any problems that may develop before the next visit here.   Harriette Ohara, MD 06/13/18

## 2018-06-13 NOTE — Progress Notes (Signed)
[facility] Follow up:    Dominique Noon, MD [place] [address]   DIAGNOSIS: Cancer Staging Breast cancer of upper-outer quadrant of right female breast [facility]) Staging form: Breast, AJCC 7th Edition - Clinical: No stage assigned - Unsigned - Pathologic: Stage IA (T1a, N0, cM0) - Signed by Gardenia Phlegm, NP on 02/12/2017   SUMMARY OF ONCOLOGIC HISTORY:   Breast cancer of upper-outer quadrant of right female breast ([place])   05/11/2016 Initial Diagnosis    Right breast biopsy 9:30 position 7cmfn: Fibrocystic change (5 mm); 5 cmfn: IDC grade 2, DCIS, LN axilla: Biopsy neg; ER 95%, PR 95%, HER-2 negative ratio 1.38, Ki-67 10%; right breast mass 9 mm, T1b N0 stage IA      05/20/2016 Genetic Testing    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  Genetic testing did not reveal a deleterious mutation.  Genes tested include: APC, ATM, AXIN2, BARD1, BMPR1A, BRCA1, BRCA2, BRIP1, CDH1, CDKN2A, CHEK2, DICER1, EPCAM, GREM1, KIT, MEN1, MLH1, MSH2, MSH6, MUTYH, NBN, NF1, PALB2, PDGFRA, PMS2, POLD1, POLE, PTEN, RAD50, RAD51C, RAD51D, SDHA, SDHB, SDHC, SDHD, SMAD4, SMARCA4. STK11, TP53, TSC1, TSC2, and VHL.      07/01/2016 Surgery    Right lumpectomy: IDC grade 3, 0.5 cm, IgG DCIS, margins negative, 0/4 SLN negative, ER 95%, PR 95%, HER-2/neu negative, Ki-67 10%, T1 N0 stage IA      08/06/2016 - 09/21/2016 Radiation Therapy    Adj XRT [name]), 1. The right breast was treated to 50.4 Gy in 28 fractions at 1.8 Gy per fraction.  2. The right breast was boosted to 10 Gy in 5 fractions at 2 Gy per fraction.       10/05/2016 -  Anti-estrogen oral therapy    Tamoxifen 20 mg daily       CURRENT THERAPY:  INTERVAL HISTORY: Dominique Murphy 47 y.o. female returns for f/u of her estrogen positive breast cancer.  She is taking Tamoxifen daily and is tolerating it well.  She denies any other issues today with  taking it.  She does note a new diagnosis of hypothyroidism and is taking Synthroid daily and is tolerating it well.    Her last mammogram was 04/2018 and was normal.  She sees gynecology next week.     Patient Active Problem List   Diagnosis Date Noted  . Genetic testing 06/04/2016  . Breast cancer of upper-outer quadrant of right female breast ([place]) 05/15/2016  . Family history of colon cancer 03/10/2012  . CIN I (cervical intraepithelial neoplasia I)     is allergic to morphine and related and percocet [oxycodone-acetaminophen].  MEDICAL HISTORY: Past Medical History:  Diagnosis Date  . Allergy   . Breast cancer ([place]) 05/11/16   Right Breast   . 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 Personal history of radiation therapy     SURGICAL HISTORY: Past Surgical History:  Procedure Laterality Date  . [place]  2002  . BREAST BIOPSY    . BREAST LUMPECTOMY Right 2017  . BREAST LUMPECTOMY WITH RADIOACTIVE SEED AND SENTINEL LYMPH NODE BIOPSY Right 07/01/2016   Procedure: RIGHT BREAST LUMPECTOMY WITH RADIOACTIVE SEED X'S 2 AND SENTINEL LYMPH NODE BIOPSY;  Surgeon: Rolm Bookbinder, MD;  Location: [facility];  Service: General;  Laterality: Right;  . CESAREAN SECTION  2000  . COLONOSCOPY    . COLPOSCOPY    . MIRENA     Inserted 02/20/2014  . WISDOM TOOTH EXTRACTION      SOCIAL HISTORY: Social History   Socioeconomic History  . Marital status: Married    Spouse name: Marya Amsler  . Number of children: 1  . Years of education: Not on file  . Highest education level: Not on file  Occupational History  . Occupation: Architectural technologist: [employer]  Social Needs  . Financial resource strain: Not on file  . Food insecurity:    Worry: Not on file    Inability: Not on file  . Transportation needs:    Medical: Not on file    Non-medical:  Not on file  Tobacco Use  . Smoking status: Never Smoker  . Smokeless tobacco: Never Used  Substance and Sexual Activity  . Alcohol use: Yes    Alcohol/week: 1.2 oz    Types: 2 Standard drinks or equivalent per week    Comment: socially  . Drug use: No  . Sexual activity: Yes    Comment: Mirena inserted 02/20/2014-1st intercourse 47 yo-Fewer than 5 partners  Lifestyle  . Physical activity:    Days per week: Not on file    Minutes per session: Not on file  . Stress: Not on file  Relationships  . Social connections:    Talks on phone: Not on file    Gets together: Not on file    Attends religious service: Not on file    Active member of club or organization: Not on file    Attends meetings of clubs or organizations: Not on file    Relationship status: Not on file  . Intimate partner violence:    Fear of current or ex partner: Not on file    Emotionally abused: Not on file    Physically abused: Not on file    Forced sexual activity: Not on file  Other Topics Concern  . Not on file  Social History Narrative  . Not on file    FAMILY HISTORY: Family History  Problem Relation Age of Onset  . Colon cancer Father 29  . Heart disease Maternal Grandfather   . Skin cancer Sister 29  . Heart disease Paternal Grandfather   . Colon cancer Other   . Esophageal cancer Neg Hx   . Colon polyps Neg Hx   . Rectal cancer Neg Hx   . Stomach cancer Neg Hx     Review of Systems - Oncology    PHYSICAL EXAMINATION  ECOG PERFORMANCE STATUS: 1 - Symptomatic but completely ambulatory  Vitals:   06/13/18 1434  BP: 118/84  Pulse: 87  Resp: 19  Temp: 98.4 F (36.9 C)  SpO2: 100%    Physical Exam  LABORATORY DATA:  CBC    Component Value Date/Time   WBC 5.3 06/18/2017 1533   RBC 4.30 06/18/2017 1533   HGB 13.3 06/18/2017 1533   HCT 40.7 06/18/2017 1533   PLT 267 06/18/2017 1533   MCV 94.7 06/18/2017 1533   MCH 30.9 06/18/2017 1533   MCHC 32.7 06/18/2017 1533   RDW 13.0  06/18/2017 1533   LYMPHSABS 2,014 06/18/2017 1533   MONOABS 477 06/18/2017 1533   EOSABS 106 06/18/2017 1533   BASOSABS 0 06/18/2017 1533    CMP     Component Value Date/Time   NA 139 12/28/2017 0908   K 4.3 12/28/2017 0908   CL 107 12/28/2017  0908   CO2 27 12/28/2017 0908   GLUCOSE 97 12/28/2017 0908   BUN 16 12/28/2017 0908   CREATININE 0.80 12/28/2017 0908   CALCIUM 9.8 12/28/2017 0908   PROT 7.2 12/28/2017 0908   ALBUMIN 4.4 06/18/2017 1533   AST 29 12/28/2017 0908   ALT 29 12/28/2017 0908   ALKPHOS 50 06/18/2017 1533   BILITOT 0.4 12/28/2017 0908       PENDING LABS:   RADIOGRAPHIC STUDIES:  No results found.   PATHOLOGY:     ASSESSMENT and THERAPY PLAN:   Breast cancer of upper-outer quadrant of right female breast ([place]) Right lumpectomy 07/01/2016: IDC grade 3, 0.5 cm, IgG DCIS, margins negative, 0/4 SLN negative, ER 95%, PR 95%, HER-2/neu negative, Ki-67 10%, T1 N0 stage IA Patient had started tamoxifen 20 mg daily 05/19/2016 because surgery was slightly delayed. Oncotype DX score 12, 8% risk of recurrence  Tamoxifen toxicities:Denies any hot flashes or myalgias. Adj XRT 08/06/16 to 09/21/16  Treatment Plan: Adjuvant antiestrogen therapy with tamoxifen for 10 years  Tamoxifen toxicities: Denies any hot flashes or myalgias.  Breast cancer surveillance: 1. Breast exam 06/13/2018: Benign 2. mammogram  05/03/2018: Benign breast density category C  Return to clinic in 1 year for follow-up     No orders of the defined types were placed in this encounter.   All questions were answered. The patient knows to call the clinic with any problems, questions or concerns. We can certainly see the patient much sooner if necessary. This note was electronically signed. Scot Dock, NP 06/13/2018

## 2018-06-14 ENCOUNTER — Telehealth: Payer: Self-pay | Admitting: Hematology and Oncology

## 2018-06-14 NOTE — Telephone Encounter (Signed)
Mailed patient calendar of upcoming July 2020 appts.

## 2018-06-15 ENCOUNTER — Encounter: Payer: Self-pay | Admitting: Gynecology

## 2018-06-20 ENCOUNTER — Encounter: Payer: Self-pay | Admitting: Gynecology

## 2018-06-20 NOTE — Telephone Encounter (Signed)
OK to order or wait until after you see her and you can order with all the other labs.  Her appointment for CE with you is not until 2 pm.

## 2018-06-20 NOTE — Telephone Encounter (Signed)
Fasting not necessary for thyroid but if I am also doing other labs like fasting lipid profile then she really should be fasting.  2 options would be 90 until she sees me at 2:00 or come in earlier fasting.  She also could just wait and I could order blood work tomorrow and she could come back over the next several weeks fasting whenever is convenient for her

## 2018-06-21 ENCOUNTER — Encounter: Payer: Self-pay | Admitting: Gynecology

## 2018-06-21 ENCOUNTER — Ambulatory Visit (INDEPENDENT_AMBULATORY_CARE_PROVIDER_SITE_OTHER): Payer: BLUE CROSS/BLUE SHIELD | Admitting: Gynecology

## 2018-06-21 VITALS — BP 118/76 | Ht 66.5 in | Wt 159.0 lb

## 2018-06-21 DIAGNOSIS — Z01419 Encounter for gynecological examination (general) (routine) without abnormal findings: Secondary | ICD-10-CM

## 2018-06-21 DIAGNOSIS — Z853 Personal history of malignant neoplasm of breast: Secondary | ICD-10-CM

## 2018-06-21 NOTE — Patient Instructions (Addendum)
Follow-up in 1 year for annual exam, sooner if any issues.  If you cannot arrange to have the lipid profile done elsewhere then call back and we will do it here.

## 2018-06-21 NOTE — Progress Notes (Signed)
    Dominique Murphy December 13, 1970 [id number]        47 y.o.  G1P1001 for annual gynecologic exam.  Doing well without gynecologic complaints.  Past medical history,surgical history, problem list, medications, allergies, family history and social history were all reviewed and documented as reviewed in the EPIC chart.  ROS:  Performed with pertinent positives and negatives included in the history, assessment and plan.   Additional significant findings : None   Exam: [name] assistant Vitals:   06/21/18 1401  BP: 118/76  Weight: 159 lb (72.1 kg)  Height: 5' 6.5" (1.689 m)   Body mass index is 25.28 kg/m.  General appearance:  Normal affect, orientation and appearance. Skin: Grossly normal HEENT: Without gross lesions.  No cervical or supraclavicular adenopathy. Thyroid normal.  Lungs:  Clear without wheezing, rales or rhonchi Cardiac: RR, without RMG Abdominal:  Soft, nontender, without masses, guarding, rebound, organomegaly or hernia Breasts:  Examined lying and sitting without masses, retractions, discharge or axillary adenopathy.  Well-healed right lumpectomy scar Pelvic:  Ext, BUS, Vagina: Normal  Cervix: Normal  Uterus: Anteverted well-healed, normal size, shape and contour, midline and mobile nontender   Adnexa: Without masses or tenderness    Anus and perineum: Normal   Rectovaginal: Normal sphincter tone without palpated masses or tenderness.    Assessment/Plan:  47 y.o. G32P1001 female for annual gynecologic exam.   1. Postmenopausal.  Patient remains amenorrheic.  Without significant menopausal symptoms.  No vaginal bleeding. 2. Recent diagnosis of hypothyroidism.  Now being seen and treated by Dr. Chalmers Cater.  She will continue to follow-up with her in reference to this. 3. Right breast cancer.  Actively being followed by oncology.  Exam NED.  Mammography 04/2018. 4. Pap smear 2017.  Pap smear/HPV 2015.  No Pap smear done today.  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  Plan repeat Pap smear next year at 3-year interval. 5. Colonoscopy 2018.  Repeat at their recommended interval. 6. Health maintenance.  No routine lab work done.  She is going to have a fasting lipid profile done at Dr. Almetta Lovely office when she has her next TSH done.  Follow-up in 1 year, sooner as needed.   Anastasio Auerbach MD, 2:27 PM 06/21/2018

## 2018-07-28 ENCOUNTER — Telehealth: Payer: Self-pay | Admitting: *Deleted

## 2018-07-28 DIAGNOSIS — Z01419 Encounter for gynecological examination (general) (routine) without abnormal findings: Secondary | ICD-10-CM

## 2018-07-28 DIAGNOSIS — Z780 Asymptomatic menopausal state: Secondary | ICD-10-CM

## 2018-07-28 DIAGNOSIS — E039 Hypothyroidism, unspecified: Secondary | ICD-10-CM

## 2018-07-28 DIAGNOSIS — Z13220 Encounter for screening for lipoid disorders: Secondary | ICD-10-CM

## 2018-07-28 NOTE — Telephone Encounter (Signed)
Okay for CBC, CMP, lipid profile.  I would also recommend checking a TSH with her history.  I do not find checking estradiol and progesterone levels of any real benefit except in very specific cases as I would not make decisions based upon them.  But I can order them if she feels she wants to have them available for the other doctor.  I would anticipate them to be low in a patient who is postmenopausal.

## 2018-07-28 NOTE — Telephone Encounter (Signed)
Patient had annual exam on 06/21/18 was going to have labs done at [facility] office but, she has upcoming appointment with a holistic doctor, she is trying to get off medications,and she would like to have labs drawn so its covered with insurance. (labs not covered with Holistic doctor) she asked if TSH level, lipid profile, hormone levels (progesterone/estrogen levels) per patient. Please advise

## 2018-07-28 NOTE — Telephone Encounter (Signed)
Patient informed with the below, orders placed coming tomorrow fasting for labs. I did explain we cannot promise labs will be covered by insurance. Patient verbalized she understood.

## 2018-07-29 ENCOUNTER — Other Ambulatory Visit: Payer: BLUE CROSS/BLUE SHIELD

## 2018-07-29 DIAGNOSIS — Z780 Asymptomatic menopausal state: Secondary | ICD-10-CM

## 2018-07-29 DIAGNOSIS — Z13220 Encounter for screening for lipoid disorders: Secondary | ICD-10-CM

## 2018-07-29 DIAGNOSIS — Z01419 Encounter for gynecological examination (general) (routine) without abnormal findings: Secondary | ICD-10-CM

## 2018-07-29 DIAGNOSIS — E039 Hypothyroidism, unspecified: Secondary | ICD-10-CM

## 2018-07-30 LAB — COMPREHENSIVE METABOLIC PANEL
AG Ratio: 1.6 (calc) (ref 1.0–2.5)
ALT: 19 U/L (ref 6–29)
AST: 22 U/L (ref 10–35)
Albumin: 4.2 g/dL (ref 3.6–5.1)
Alkaline phosphatase (APISO): 51 U/L (ref 33–115)
BUN: 15 mg/dL (ref 7–25)
CO2: 27 mmol/L (ref 20–32)
Calcium: 9.4 mg/dL (ref 8.6–10.2)
Chloride: 104 mmol/L (ref 98–110)
Creat: 0.89 mg/dL (ref 0.50–1.10)
Globulin: 2.6 g/dL (calc) (ref 1.9–3.7)
Glucose, Bld: 93 mg/dL (ref 65–99)
Potassium: 4.1 mmol/L (ref 3.5–5.3)
Sodium: 141 mmol/L (ref 135–146)
Total Bilirubin: 0.5 mg/dL (ref 0.2–1.2)
Total Protein: 6.8 g/dL (ref 6.1–8.1)

## 2018-07-30 LAB — CBC
HCT: 36.8 % (ref 35.0–45.0)
Hemoglobin: 12.6 g/dL (ref 11.7–15.5)
MCH: 31.3 pg (ref 27.0–33.0)
MCHC: 34.2 g/dL (ref 32.0–36.0)
MCV: 91.5 fL (ref 80.0–100.0)
MPV: 11.2 fL (ref 7.5–12.5)
Platelets: 243 10*3/uL (ref 140–400)
RBC: 4.02 10*6/uL (ref 3.80–5.10)
RDW: 12 % (ref 11.0–15.0)
WBC: 3.7 10*3/uL — ABNORMAL LOW (ref 3.8–10.8)

## 2018-07-30 LAB — PROGESTERONE: Progesterone: <0.5 ng/mL

## 2018-07-30 LAB — LIPID PANEL
Cholesterol: 221 mg/dL — ABNORMAL HIGH (ref <200)
HDL: 93 mg/dL (ref >50)
LDL Cholesterol (Calc): 114 mg/dL (calc) — ABNORMAL HIGH
Non-HDL Cholesterol (Calc): 128 mg/dL (calc) (ref <130)
Total CHOL/HDL Ratio: 2.4 (calc) (ref <5.0)
Triglycerides: 59 mg/dL (ref <150)

## 2018-07-30 LAB — TSH: TSH: 1.82 mIU/L

## 2018-07-30 LAB — ESTRADIOL: Estradiol: <15 pg/mL

## 2018-08-01 ENCOUNTER — Encounter: Payer: Self-pay | Admitting: Gynecology

## 2018-08-08 ENCOUNTER — Other Ambulatory Visit: Payer: Self-pay | Admitting: Hematology and Oncology

## 2018-08-08 DIAGNOSIS — C50411 Malignant neoplasm of upper-outer quadrant of right female breast: Secondary | ICD-10-CM

## 2018-08-08 DIAGNOSIS — Z170 Estrogen receptor positive status [ER+]: Secondary | ICD-10-CM

## 2018-08-24 ENCOUNTER — Encounter: Payer: Self-pay | Admitting: Hematology and Oncology

## 2018-10-24 ENCOUNTER — Encounter: Payer: Self-pay | Admitting: Gynecology

## 2018-10-24 MED ORDER — ALPRAZOLAM 0.5 MG PO TABS: ORAL_TABLET | ORAL | 4 refills | Status: DC

## 2018-10-24 NOTE — Telephone Encounter (Signed)
Okay for Xanax 0.5 mg #31 p.o. as needed sleep with 4 refills

## 2018-10-24 NOTE — Addendum Note (Signed)
Addended by: Ramond Craver on: 10/24/2018 02:08 PM   Modules accepted: Orders

## 2018-10-27 ENCOUNTER — Other Ambulatory Visit: Payer: Self-pay | Admitting: Gynecology

## 2018-10-27 MED ORDER — ALPRAZOLAM 1 MG PO TABS: ORAL_TABLET | ORAL | 3 refills | Status: DC

## 2018-10-27 MED ORDER — ALPRAZOLAM 0.5 MG PO TABS: ORAL_TABLET | ORAL | 4 refills | Status: DC

## 2018-10-27 NOTE — Addendum Note (Signed)
Addended by: Ramond Craver on: 10/27/2018 12:19 PM   Modules accepted: Orders

## 2018-10-27 NOTE — Addendum Note (Signed)
Addended by: Ramond Craver on: 10/27/2018 12:06 PM   Modules accepted: Orders

## 2018-10-27 NOTE — Telephone Encounter (Signed)
Okay for the 1 mg dose with the instructions to take 1/2 tablet nightly as needed for sleep #30 with 3 refills

## 2018-11-23 ENCOUNTER — Encounter: Payer: Self-pay | Admitting: Hematology and Oncology

## 2019-01-06 ENCOUNTER — Encounter: Payer: Self-pay | Admitting: Hematology and Oncology

## 2019-03-20 ENCOUNTER — Other Ambulatory Visit: Payer: Self-pay | Admitting: Hematology and Oncology

## 2019-03-20 DIAGNOSIS — Z1231 Encounter for screening mammogram for malignant neoplasm of breast: Secondary | ICD-10-CM

## 2019-04-27 ENCOUNTER — Telehealth: Payer: Self-pay | Admitting: *Deleted

## 2019-04-27 MED ORDER — ALPRAZOLAM 1 MG PO TABS: ORAL_TABLET | ORAL | 2 refills | Status: DC

## 2019-04-27 NOTE — Telephone Encounter (Signed)
Patient called requesting refill on Xanax 1 mg tablet. Please advise

## 2019-04-27 NOTE — Telephone Encounter (Signed)
Patient aware, Rx called in.

## 2019-04-27 NOTE — Telephone Encounter (Signed)
Okay for Xanax 1 mg #30 with 2 refills

## 2019-05-09 ENCOUNTER — Other Ambulatory Visit: Payer: Self-pay | Admitting: Hematology and Oncology

## 2019-05-09 DIAGNOSIS — Z853 Personal history of malignant neoplasm of breast: Secondary | ICD-10-CM

## 2019-05-11 ENCOUNTER — Ambulatory Visit
Admission: RE | Admit: 2019-05-11 | Discharge: 2019-05-11 | Disposition: A | Discharge status: Home / Self Care | Payer: BC Managed Care – PPO | Source: Ambulatory Visit | Attending: Hematology and Oncology | Admitting: Hematology and Oncology

## 2019-05-11 ENCOUNTER — Other Ambulatory Visit: Payer: Self-pay

## 2019-05-11 DIAGNOSIS — Z853 Personal history of malignant neoplasm of breast: Secondary | ICD-10-CM

## 2019-06-07 ENCOUNTER — Telehealth: Payer: Self-pay | Admitting: Hematology and Oncology

## 2019-06-07 NOTE — Telephone Encounter (Signed)
I talk with patient regarding video visit °

## 2019-06-07 NOTE — Assessment & Plan Note (Signed)
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 Oncotype DX score 12, 8% risk of recurrence  Tamoxifen toxicities:Denies any hot flashes or myalgias. Adj XRT 08/06/16 to 09/21/16  Treatment Plan: Adjuvant antiestrogen therapy with tamoxifen for 10 years  Tamoxifen toxicities: Denies any hot flashes or myalgias.  Breast cancer surveillance: 1.Breast exam has been done by her gynecologist. 2.mammogram  05/11/2019: Benign breast density category C  Return to clinic in 1 year for follow-up

## 2019-06-09 NOTE — Progress Notes (Signed)
HEMATOLOGY-ONCOLOGY MYCHART VIDEO VISIT PROGRESS NOTE  I connected with Dominique Murphy on 06/12/2019 at  2:15 PM EDT by MyChart video conference and verified that I am speaking with the correct person using two identifiers.  I discussed the limitations, risks, security and privacy concerns of performing an evaluation and management service by MyChart and the availability of in person appointments.  I also discussed with the patient that there may be a patient responsible charge related to this service. The patient expressed understanding and agreed to proceed.  Patient's Location: Home Physician Location: Clinic  CHIEF COMPLIANT: Follow-up of right breast cancer on tamoxifen   INTERVAL HISTORY: Dominique Murphy is a 48 y.o. female with above-mentioned history of right breast cancer treated with lumpectomy, radiation, and who is currently on anti-estrogen therapy with tamoxifen. I last saw her a year ago. Mammogram on 05/11/19 showed no evidence of malignancy bilaterally. She is over [place] today for follow-up.   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  Genetic testing did not reveal a deleterious mutation.  Genes tested include: APC, ATM, AXIN2, BARD1, BMPR1A, BRCA1, BRCA2, BRIP1, CDH1, CDKN2A, CHEK2, DICER1, EPCAM, GREM1, KIT, MEN1, MLH1, MSH2, MSH6, MUTYH, NBN, NF1, PALB2, PDGFRA, PMS2, POLD1, POLE, PTEN, RAD50, RAD51C, RAD51D, SDHA, SDHB, SDHC, SDHD, SMAD4, SMARCA4. STK11, TP53, TSC1, TSC2, and VHL.   07/01/2016 Surgery   Right lumpectomy: IDC grade 3, 0.5 cm, IgG DCIS, margins negative,  0/4 SLN negative, ER 95%, PR 95%, HER-2/neu negative, Ki-67 10%, T1 N0 stage IA   08/06/2016 - 09/21/2016 Radiation Therapy   Adj XRT [name]), 1. The right breast was treated to 50.4 Gy in 28 fractions at 1.8 Gy per fraction.  2. The right breast was boosted to 10 Gy in 5 fractions at 2 Gy per fraction.    10/05/2016 -  Anti-estrogen oral therapy   Tamoxifen 20 mg daily     REVIEW OF SYSTEMS:   Constitutional: Denies fevers, chills or abnormal weight loss Eyes: Denies blurriness of vision Ears, nose, mouth, throat, and face: Denies mucositis or sore throat Respiratory: Denies cough, dyspnea or wheezes Cardiovascular: Denies palpitation, chest discomfort Gastrointestinal:  Denies nausea, heartburn or change in bowel habits Skin: Denies abnormal skin rashes Lymphatics: Denies new lymphadenopathy or easy bruising Neurological:Denies numbness, tingling or new weaknesses Behavioral/Psych: Mood is stable, no new changes  Extremities: No lower extremity edema Breast: denies any pain or lumps or nodules in either breasts All other systems were reviewed with the patient and are negative.  Observations/Objective:  There were no vitals filed for this visit. There is no height or weight on file to calculate BMI.  I have reviewed the data as listed CMP Latest Ref Rng & Units 07/29/2018 12/28/2017 06/18/2017  Glucose 65 - 99 mg/dL 93 97 84  BUN 7 - 25 mg/dL _0 Creatinine 0.50 - 1.10 mg/dL 0.89 0.80 0.86  Sodium 135 - 146 mmol/L 141 139 139  Potassium 3.5 - 5.3 mmol/L 4.1 4.3 4.3  Chloride 98 - 110 mmol/L 104 107 102  CO2 20 - 32  mmol/L _0 Calcium 8.6 - 10.2 mg/dL 9.4 9.8 9.8  Total Protein 6.1 - 8.1 g/dL 6.8 7.2 7.3  Total Bilirubin 0.2 - 1.2 mg/dL 0.5 0.4 0.7  Alkaline Phos 33 - 115 U/L - - 50  AST 10 - 35 U/L _1 ALT 6 - 29 U/L _2 Lab Results  Component Value Date   WBC 3.7 (L) 07/29/2018   HGB 12.6 07/29/2018   HCT 36.8 07/29/2018   MCV 91.5 07/29/2018    PLT 243 07/29/2018   NEUTROABS 2,703 06/18/2017      Assessment Plan:  Breast cancer of upper-outer quadrant of right female breast ([place]) Right lumpectomy 07/01/2016: IDC grade 3, 0.5 cm, IgG DCIS, margins negative, 0/4 SLN negative, ER 95%, PR 95%, HER-2/neu negative, Ki-67 10%, T1 N0 stage IA Patient had started tamoxifen 20 mg daily 05/19/2016 because surgery was slightly delayed. Oncotype DX score 12, 8% risk of recurrence  Tamoxifen toxicities:Denies any hot flashes or myalgias. Adj XRT 08/06/16 to 09/21/16  Treatment Plan: Adjuvant antiestrogen therapy with tamoxifen for 10 years Hypothyroidism  Tamoxifen toxicities: Denies any hot flashes or myalgias.  Breast cancer surveillance: 1.Breast exam has been done by her gynecologist. 2.mammogram  05/11/2019: Benign breast density category C  Return to clinic in 1 year for follow-up as a MyChart virtual visit    I discussed the assessment and treatment plan with the patient. The patient was provided an opportunity to ask questions and all were answered. The patient agreed with the plan and demonstrated an understanding of the instructions. The patient was advised to call back or seek an in-person evaluation if the symptoms worsen or if the condition fails to improve as anticipated.   I provided 15 minutes of face-to-face MyChart video visit time during this encounter.    Dominique Eisenmenger, MD 06/12/2019   I, Dominique Murphy, am acting as scribe for Dominique Lose, MD.  I have reviewed the above documentation for accuracy and completeness, and I agree with the above.

## 2019-06-12 ENCOUNTER — Inpatient Hospital Stay: Payer: BC Managed Care – PPO | Attending: Hematology and Oncology | Admitting: Hematology and Oncology

## 2019-06-12 DIAGNOSIS — C50411 Malignant neoplasm of upper-outer quadrant of right female breast: Secondary | ICD-10-CM | POA: Diagnosis present

## 2019-06-12 DIAGNOSIS — Z170 Estrogen receptor positive status [ER+]: Secondary | ICD-10-CM | POA: Insufficient documentation

## 2019-06-12 DIAGNOSIS — Z79810 Long term (current) use of selective estrogen receptor modulators (SERMs): Secondary | ICD-10-CM | POA: Insufficient documentation

## 2019-06-12 MED ORDER — THYROID 65 MG PO TABS: 65.0000 mg | ORAL_TABLET | Freq: Every day | ORAL | Status: DC

## 2019-06-12 MED ORDER — TAMOXIFEN CITRATE 20 MG PO TABS: 20.0000 mg | ORAL_TABLET | Freq: Every day | ORAL | 4 refills | Status: DC

## 2019-06-13 ENCOUNTER — Encounter: Payer: Self-pay | Admitting: Gynecology

## 2019-06-14 ENCOUNTER — Other Ambulatory Visit: Payer: Self-pay | Admitting: Gynecology

## 2019-06-14 DIAGNOSIS — Z1321 Encounter for screening for nutritional disorder: Secondary | ICD-10-CM

## 2019-06-14 DIAGNOSIS — Z13220 Encounter for screening for lipoid disorders: Secondary | ICD-10-CM

## 2019-06-14 DIAGNOSIS — Z01419 Encounter for gynecological examination (general) (routine) without abnormal findings: Secondary | ICD-10-CM

## 2019-06-14 DIAGNOSIS — R7989 Other specified abnormal findings of blood chemistry: Secondary | ICD-10-CM

## 2019-06-14 DIAGNOSIS — Z1329 Encounter for screening for other suspected endocrine disorder: Secondary | ICD-10-CM

## 2019-06-14 NOTE — Telephone Encounter (Signed)
Okay for CBC, CMP, lipid profile, thyroid panel with TSH, vitamin D.  Not sure menopausal levels worth drawing as we know that she is in menopause.  Also not sure why she is asking for ferritin.  CBC will look at her blood count which is a indirect measure of iron

## 2019-06-14 NOTE — Telephone Encounter (Signed)
Okay for ferritin level also

## 2019-06-15 ENCOUNTER — Other Ambulatory Visit: Payer: Self-pay

## 2019-06-16 ENCOUNTER — Other Ambulatory Visit: Payer: Self-pay

## 2019-06-19 ENCOUNTER — Other Ambulatory Visit: Payer: BC Managed Care – PPO

## 2019-06-19 ENCOUNTER — Other Ambulatory Visit: Payer: Self-pay

## 2019-06-19 DIAGNOSIS — R7989 Other specified abnormal findings of blood chemistry: Secondary | ICD-10-CM

## 2019-06-19 DIAGNOSIS — Z1329 Encounter for screening for other suspected endocrine disorder: Secondary | ICD-10-CM

## 2019-06-19 DIAGNOSIS — Z1321 Encounter for screening for nutritional disorder: Secondary | ICD-10-CM

## 2019-06-19 DIAGNOSIS — Z13220 Encounter for screening for lipoid disorders: Secondary | ICD-10-CM

## 2019-06-19 DIAGNOSIS — Z01419 Encounter for gynecological examination (general) (routine) without abnormal findings: Secondary | ICD-10-CM

## 2019-06-20 LAB — THYROID PANEL WITH TSH
Free Thyroxine Index: 1.5 (ref 1.4–3.8)
T3 Uptake: 31 % (ref 22–35)
T4, Total: 4.7 ug/dL — ABNORMAL LOW (ref 5.1–11.9)
TSH: 1.19 mIU/L

## 2019-06-20 LAB — CBC WITH DIFFERENTIAL/PLATELET
Absolute Monocytes: 467 cells/uL (ref 200–950)
Basophils Absolute: 21 cells/uL (ref 0–200)
Basophils Relative: 0.5 %
Eosinophils Absolute: 103 cells/uL (ref 15–500)
Eosinophils Relative: 2.5 %
HCT: 36.1 % (ref 35.0–45.0)
Hemoglobin: 12.2 g/dL (ref 11.7–15.5)
Lymphs Abs: 2087 cells/uL (ref 850–3900)
MCH: 31.1 pg (ref 27.0–33.0)
MCHC: 33.8 g/dL (ref 32.0–36.0)
MCV: 92.1 fL (ref 80.0–100.0)
MPV: 11.6 fL (ref 7.5–12.5)
Monocytes Relative: 11.4 %
Neutro Abs: 1423 cells/uL — ABNORMAL LOW (ref 1500–7800)
Neutrophils Relative %: 34.7 %
Platelets: 227 10*3/uL (ref 140–400)
RBC: 3.92 10*6/uL (ref 3.80–5.10)
RDW: 11.7 % (ref 11.0–15.0)
Total Lymphocyte: 50.9 %
WBC: 4.1 10*3/uL (ref 3.8–10.8)

## 2019-06-20 LAB — COMPREHENSIVE METABOLIC PANEL
AG Ratio: 1.7 (calc) (ref 1.0–2.5)
ALT: 15 U/L (ref 6–29)
AST: 22 U/L (ref 10–35)
Albumin: 4.1 g/dL (ref 3.6–5.1)
Alkaline phosphatase (APISO): 42 U/L (ref 31–125)
BUN: 11 mg/dL (ref 7–25)
CO2: 24 mmol/L (ref 20–32)
Calcium: 9.4 mg/dL (ref 8.6–10.2)
Chloride: 105 mmol/L (ref 98–110)
Creat: 0.63 mg/dL (ref 0.50–1.10)
Globulin: 2.4 g/dL (calc) (ref 1.9–3.7)
Glucose, Bld: 97 mg/dL (ref 65–99)
Potassium: 4.1 mmol/L (ref 3.5–5.3)
Sodium: 140 mmol/L (ref 135–146)
Total Bilirubin: 0.4 mg/dL (ref 0.2–1.2)
Total Protein: 6.5 g/dL (ref 6.1–8.1)

## 2019-06-20 LAB — LIPID PANEL
Cholesterol: 183 mg/dL (ref <200)
HDL: 75 mg/dL (ref >=50)
LDL Cholesterol (Calc): 90 mg/dL (calc)
Non-HDL Cholesterol (Calc): 108 mg/dL (calc) (ref <130)
Total CHOL/HDL Ratio: 2.4 (calc) (ref <5.0)
Triglycerides: 89 mg/dL (ref <150)

## 2019-06-20 LAB — VITAMIN D 25 HYDROXY (VIT D DEFICIENCY, FRACTURES): Vit D, 25-Hydroxy: 32 ng/mL (ref 30–100)

## 2019-06-20 LAB — FERRITIN: Ferritin: 82 ng/mL (ref 16–232)

## 2019-06-22 ENCOUNTER — Telehealth: Payer: Self-pay | Admitting: Hematology and Oncology

## 2019-06-22 ENCOUNTER — Other Ambulatory Visit: Payer: Self-pay

## 2019-06-22 NOTE — Telephone Encounter (Signed)
I talk with patient regarding schedule  

## 2019-06-23 ENCOUNTER — Ambulatory Visit: Payer: BC Managed Care – PPO | Admitting: Gynecology

## 2019-06-23 ENCOUNTER — Encounter: Payer: Self-pay | Admitting: Gynecology

## 2019-06-23 VITALS — BP 118/76 | Ht 66.5 in | Wt 166.0 lb

## 2019-06-23 DIAGNOSIS — Z01419 Encounter for gynecological examination (general) (routine) without abnormal findings: Secondary | ICD-10-CM | POA: Diagnosis not present

## 2019-06-23 DIAGNOSIS — Z853 Personal history of malignant neoplasm of breast: Secondary | ICD-10-CM

## 2019-06-23 DIAGNOSIS — R7989 Other specified abnormal findings of blood chemistry: Secondary | ICD-10-CM

## 2019-06-23 DIAGNOSIS — Z1151 Encounter for screening for human papillomavirus (HPV): Secondary | ICD-10-CM

## 2019-06-23 MED ORDER — VITAMIN D (ERGOCALCIFEROL) 1.25 MG (50000 UNIT) PO CAPS: 50000.0000 [IU] | ORAL_CAPSULE | ORAL | 2 refills | Status: DC

## 2019-06-23 NOTE — Progress Notes (Signed)
    Dominique Murphy 08/26/1971 [id number]        48 y.o.  G1P1001 for annual gynecologic exam.  Without gynecologic complaints.  Past medical history,surgical history, problem list, medications, allergies, family history and social history were all reviewed and documented as reviewed in the EPIC chart.  ROS:  Performed with pertinent positives and negatives included in the history, assessment and plan.   Additional significant findings : None   Exam: [name] assistant Vitals:   06/23/19 0928  BP: 118/76  Weight: 166 lb (75.3 kg)  Height: 5' 6.5" (1.689 m)   Body mass index is 26.39 kg/m.  General appearance:  Normal affect, orientation and appearance. Skin: Grossly normal HEENT: Without gross lesions.  No cervical or supraclavicular adenopathy. Thyroid normal.  Lungs:  Clear without wheezing, rales or rhonchi Cardiac: RR, without RMG Abdominal:  Soft, nontender, without masses, guarding, rebound, organomegaly or hernia Breasts:  Examined lying and sitting without masses, retractions, discharge or axillary adenopathy.  Well-healed right lumpectomy scar  Pelvic:  Ext, BUS, Vagina: Normal with mild atrophic changes  Cervix: Normal.  Pap smear/HPV  Uterus: Anteverted, normal size, shape and contour, midline and mobile nontender   Adnexa: Without masses or tenderness    Anus and perineum: Normal   Rectovaginal: Normal sphincter tone without palpated masses or tenderness.    Assessment/Plan:  48 y.o. G31P1001 female for annual gynecologic exam.   1. Postmenopausal.  No significant menopausal symptoms or any vaginal bleeding. 2. Low vitamin D.  Recent lab work showed vitamin D in the low 30 range.  Has been trying to supplement both with 6000 units daily as well as sublingual.  Recommended start on 50,000 units weekly x12 weeks then recheck vitamin D level.  Prescription provided and she will follow-up for labs and recommendations following. 3. History of right breast cancer.   Exam NED.  Mammography 04/2019.  Continue follow-up with oncology.  Exam NED today. 4. Pap smear 2017.  Pap smear/HPV 2015.  Pap smear/HPV today.  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 5. Colonoscopy 2018.  Repeat at their recommended interval. 6. DEXA never.  Will plan further into the menopause. 7. Health maintenance.  Viewed recent lab work which was all normal with the exception of a low her vitamin D.  Also had slightly lower thyroxine.  Actively being followed for hypothyroidism elsewhere and will follow-up with them.  Follow-up 1 year, sooner as needed   Anastasio Auerbach MD, 10:16 AM 06/23/2019

## 2019-06-23 NOTE — Patient Instructions (Addendum)
Start taking the extra vitamin D weekly as prescribed.  Follow-up in 3 months to have your vitamin D level checked.  Follow-up in 1 year for annual exam

## 2019-06-26 MED ORDER — ALPRAZOLAM 1 MG PO TABS: 1.0000 mg | ORAL_TABLET | Freq: Every evening | ORAL | 5 refills | Status: DC | PRN

## 2019-06-27 LAB — PAP IG AND HPV HIGH-RISK: HPV DNA High Risk: NOT DETECTED

## 2019-08-02 ENCOUNTER — Other Ambulatory Visit: Payer: Self-pay

## 2019-08-09 ENCOUNTER — Encounter: Payer: Self-pay | Admitting: Gynecology

## 2019-09-18 ENCOUNTER — Encounter: Payer: Self-pay | Admitting: Gynecology

## 2019-09-18 NOTE — Telephone Encounter (Signed)
Does not need to come in as they check the vitamin D.  Just make sure she follows up with them for results.

## 2019-09-19 ENCOUNTER — Other Ambulatory Visit: Payer: BC Managed Care – PPO

## 2020-01-10 ENCOUNTER — Telehealth: Payer: Self-pay | Admitting: *Deleted

## 2020-01-10 NOTE — Telephone Encounter (Signed)
Patient will continue care with you, called requesting refill on Xanax 1 mg tablet. Last filled on 06/26/19 with 5 refills. I will pend for approval to Rx can be sent to pharmacy. Annual exam due in Aug 2021.

## 2020-01-12 MED ORDER — ALPRAZOLAM 1 MG PO TABS: 1.0000 mg | ORAL_TABLET | Freq: Every evening | ORAL | 2 refills | Status: DC | PRN

## 2020-03-08 ENCOUNTER — Encounter: Payer: Self-pay | Admitting: Hematology and Oncology

## 2020-03-27 ENCOUNTER — Other Ambulatory Visit: Payer: Self-pay | Admitting: Hematology and Oncology

## 2020-03-27 DIAGNOSIS — Z9889 Other specified postprocedural states: Secondary | ICD-10-CM

## 2020-04-16 ENCOUNTER — Other Ambulatory Visit: Payer: Self-pay | Admitting: Obstetrics and Gynecology

## 2020-04-24 DIAGNOSIS — F5104 Psychophysiologic insomnia: Secondary | ICD-10-CM | POA: Insufficient documentation

## 2020-05-13 ENCOUNTER — Other Ambulatory Visit: Payer: Self-pay

## 2020-05-13 ENCOUNTER — Ambulatory Visit
Admission: RE | Admit: 2020-05-13 | Discharge: 2020-05-13 | Disposition: A | Discharge status: Home / Self Care | Payer: BC Managed Care – PPO | Source: Ambulatory Visit | Attending: Hematology and Oncology | Admitting: Hematology and Oncology

## 2020-05-13 DIAGNOSIS — Z9889 Other specified postprocedural states: Secondary | ICD-10-CM

## 2020-06-10 NOTE — Progress Notes (Signed)
HEMATOLOGY-ONCOLOGY MYCHART VIDEO VISIT PROGRESS NOTE  I connected with Dominique Murphy on 06/11/2020 at  9:30 AM EDT by MyChart video conference and verified that I am speaking with the correct person using two identifiers.  I discussed the limitations, risks, security and privacy concerns of performing an evaluation and management service by MyChart and the availability of in person appointments.  I also discussed with the patient that there may be a patient responsible charge related to this service. The patient expressed understanding and agreed to proceed.  Patient's Location: Home Physician Location: Clinic  CHIEF COMPLIANT: Follow-up of right breast cancer on tamoxifen   INTERVAL HISTORY: Dominique Murphy is a 49 y.o. female with above-mentioned history of right breast cancer treated with lumpectomy, radiation, and who is currently on anti-estrogen therapy with tamoxifen. Mammogram on 05/13/20 showed no evidence of malignancy bilaterally. She is over [place] today for follow-up.  She had Covid in December 2020 and has recovered from it.  Recently she has had acute bronchitis episodes which she has been on multiple antibiotics.  She had chest x-rays and CT scans at [facility].  There is no pneumonia detected on the scans.  2 mm lung nodule was noticed.  She still has a cough and some rib discomfort because of the coughing spells she may have broken some ribs.  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  Genetic testing did not reveal a deleterious mutation.  Genes tested include: APC, ATM,  AXIN2, BARD1, BMPR1A, BRCA1, BRCA2, BRIP1, CDH1, CDKN2A, CHEK2, DICER1, EPCAM, GREM1, KIT, MEN1, MLH1, MSH2, MSH6, MUTYH, NBN, NF1, PALB2, PDGFRA, PMS2, POLD1, POLE, PTEN, RAD50, RAD51C, RAD51D, SDHA, SDHB, SDHC, SDHD, SMAD4, SMARCA4. STK11, TP53, TSC1, TSC2, and VHL.   07/01/2016 Surgery   Right lumpectomy: IDC grade 3, 0.5 cm, IgG DCIS, margins negative, 0/4 SLN negative, ER 95%, PR 95%, HER-2/neu negative, Ki-67 10%, T1 N0 stage IA   08/06/2016 - 09/21/2016 Radiation Therapy   Adj XRT [name]), 1. The right breast was treated to 50.4 Gy in 28 fractions at 1.8 Gy per fraction.  2. The right breast was boosted to 10 Gy in 5 fractions at 2 Gy per fraction.    10/05/2016 -  Anti-estrogen oral therapy   Tamoxifen 20 mg daily     Observations/Objective:  There were no vitals filed for this visit. There is no height or weight on file to calculate BMI.  I have reviewed the data as listed CMP Latest Ref Rng & Units 06/19/2019 07/29/2018 12/28/2017  Glucose 65 - 99 mg/dL 97 93 97  BUN 7 - 25 mg/dL _0 Creatinine 0.50 - 1.10 mg/dL 0.63 0.89 0.80  Sodium 135 - 146 mmol/L 140 141 139  Potassium 3.5 - 5.3 mmol/L 4.1 4.1 4.3  Chloride 98 - 110 mmol/L 105 104 107  CO2 20 - 32 mmol/L _1 Calcium 8.6 - 10.2 mg/dL 9.4 9.4 9.8  Total Protein 6.1 - 8.1 g/dL 6.5 6.8 7.2  Total Bilirubin 0.2 - 1.2 mg/dL 0.4 0.5 0.4  Alkaline Phos 33 -  115 U/L - - -  AST 10 - 35 U/L _0 ALT 6 - 29 U/L _1 Lab Results  Component Value Date   WBC 4.1 06/19/2019   HGB 12.2 06/19/2019   HCT 36.1 06/19/2019   MCV 92.1 06/19/2019   PLT 227 06/19/2019   NEUTROABS 1,423 (L) 06/19/2019      Assessment Plan:  Breast cancer of upper-outer quadrant of right female breast ([place]) Right lumpectomy 07/01/2016: IDC grade 3, 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 Oncotype DX score 12, 8% risk of recurrence  Tamoxifen toxicities:Denies any hot flashes or myalgias. Adj XRT 08/06/16 to 09/21/16  Treatment Plan: Adjuvant antiestrogen therapy with tamoxifen for 10 years Hypothyroidism: On Armour Thyroid  Acute bronchitis: Currently on antibiotics. She had COVID-19 in December/January  Tamoxifen toxicities: Denies any hot flashes or myalgias.  Breast cancer surveillance: 1.Breast examshave been done by her gynecologist. 2.mammogram6/28/2021: Benign breast density category C  Return to clinic in 1 year for follow-up as a MyChart virtual visit    I discussed the assessment and treatment plan with the patient. The patient was provided an opportunity to ask questions and all were answered. The patient agreed with the plan and demonstrated an understanding of the instructions. The patient was advised to call back or seek an in-person evaluation if the symptoms worsen or if the condition fails to improve as anticipated.   I provided 20 minutes of face-to-face MyChart video visit time during this encounter.    Rulon Eisenmenger, MD 06/11/2020   I, Molly Dorshimer, am acting as scribe for Nicholas Lose, MD.  I have reviewed the above documentation for accuracy and completeness, and I agree with the above.

## 2020-06-11 ENCOUNTER — Inpatient Hospital Stay: Payer: BC Managed Care – PPO | Attending: Hematology and Oncology | Admitting: Hematology and Oncology

## 2020-06-11 DIAGNOSIS — Z79810 Long term (current) use of selective estrogen receptor modulators (SERMs): Secondary | ICD-10-CM | POA: Insufficient documentation

## 2020-06-11 DIAGNOSIS — Z170 Estrogen receptor positive status [ER+]: Secondary | ICD-10-CM | POA: Diagnosis not present

## 2020-06-11 DIAGNOSIS — Z923 Personal history of irradiation: Secondary | ICD-10-CM | POA: Insufficient documentation

## 2020-06-11 DIAGNOSIS — C50411 Malignant neoplasm of upper-outer quadrant of right female breast: Secondary | ICD-10-CM | POA: Insufficient documentation

## 2020-06-11 MED ORDER — TAMOXIFEN CITRATE 20 MG PO TABS: 20.0000 mg | ORAL_TABLET | Freq: Every day | ORAL | 3 refills | Status: DC

## 2020-06-11 MED ORDER — THYROID 60 MG PO TABS
60.0000 mg | ORAL_TABLET | Freq: Two times a day (BID) | ORAL | Status: DC
Start: 2020-06-11 — End: 2023-10-05

## 2020-06-11 NOTE — Assessment & Plan Note (Signed)
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 Oncotype DX score 12, 8% risk of recurrence  Tamoxifen toxicities:Denies any hot flashes or myalgias. Adj XRT 08/06/16 to 09/21/16  Treatment Plan: Adjuvant antiestrogen therapy with tamoxifen for 10 years Hypothyroidism  Tamoxifen toxicities: Denies any hot flashes or myalgias.  Breast cancer surveillance: 1.Breast examshave been done by her gynecologist. 2.mammogram6/28/2021: Benign breast density category C  Return to clinic in 1 year for follow-up as a MyChart virtual visit

## 2021-03-04 ENCOUNTER — Other Ambulatory Visit: Payer: Self-pay | Admitting: Hematology and Oncology

## 2021-03-04 ENCOUNTER — Encounter: Payer: Self-pay | Admitting: Hematology and Oncology

## 2021-03-04 DIAGNOSIS — Z853 Personal history of malignant neoplasm of breast: Secondary | ICD-10-CM

## 2021-03-05 ENCOUNTER — Other Ambulatory Visit: Payer: Self-pay

## 2021-03-05 DIAGNOSIS — C50411 Malignant neoplasm of upper-outer quadrant of right female breast: Secondary | ICD-10-CM

## 2021-03-05 DIAGNOSIS — Z170 Estrogen receptor positive status [ER+]: Secondary | ICD-10-CM

## 2021-05-13 ENCOUNTER — Other Ambulatory Visit: Payer: Self-pay

## 2021-05-13 ENCOUNTER — Ambulatory Visit: Payer: BC Managed Care – PPO

## 2021-05-13 ENCOUNTER — Ambulatory Visit
Admission: RE | Admit: 2021-05-13 | Discharge: 2021-05-13 | Disposition: A | Discharge status: Home / Self Care | Payer: BC Managed Care – PPO | Source: Ambulatory Visit | Attending: Hematology and Oncology | Admitting: Hematology and Oncology

## 2021-05-13 DIAGNOSIS — Z853 Personal history of malignant neoplasm of breast: Secondary | ICD-10-CM

## 2021-05-29 ENCOUNTER — Encounter: Payer: Self-pay | Admitting: Hematology and Oncology

## 2021-05-29 ENCOUNTER — Telehealth: Payer: Self-pay | Admitting: Hematology and Oncology

## 2021-05-29 NOTE — Telephone Encounter (Signed)
Scheduled appointment per 07/14 sch msg. Patient requested virtual. Patient is aware.

## 2021-06-02 ENCOUNTER — Telehealth: Payer: BC Managed Care – PPO | Admitting: Hematology and Oncology

## 2021-06-05 NOTE — Progress Notes (Signed)
HEMATOLOGY-ONCOLOGY MYCHART VIDEO VISIT PROGRESS NOTE  I connected with Dominique Murphy on 06/06/2021 at 12:30 PM EDT by MyChart video conference and verified that I am speaking with the correct person using two identifiers.  I discussed the limitations, risks, security and privacy concerns of performing an evaluation and management service by MyChart and the availability of in person appointments.  I also discussed with the patient that there may be a patient responsible charge related to this service. The patient expressed understanding and agreed to proceed.  Patient's Location: Home Physician Location: Clinic  CHIEF COMPLIANT: Follow-up of right breast cancer on tamoxifen therapy  INTERVAL HISTORY: Dominique Murphy is a 50 y.o. female with above-mentioned history of right breast cancer treated with lumpectomy and radiation, currently on antiestrogen therapy with tamoxifen. Mammogram on 05/13/21 showed no evidence of malignancy bilaterally. She reports via MyChart today for follow-up. She was diagnosed with covid today.  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  Genetic testing did not reveal a deleterious mutation.  Genes tested include: APC, ATM, AXIN2, BARD1, BMPR1A, BRCA1, BRCA2, BRIP1, CDH1, CDKN2A, CHEK2, DICER1, EPCAM, GREM1, KIT, MEN1, MLH1, MSH2, MSH6, MUTYH, NBN, NF1, PALB2, PDGFRA, PMS2, POLD1, POLE, PTEN, RAD50, RAD51C, RAD51D, SDHA, SDHB, SDHC, SDHD, SMAD4, SMARCA4. STK11, TP53, TSC1, TSC2, and VHL.    07/01/2016 Surgery   Right lumpectomy: IDC grade 3, 0.5 cm, IgG DCIS,  margins negative, 0/4 SLN negative, ER 95%, PR 95%, HER-2/neu negative, Ki-67 10%, T1 N0 stage IA    08/06/2016 - 09/21/2016 Radiation Therapy   Adj XRT [name]), 1. The right breast was treated to 50.4 Gy in 28 fractions at 1.8 Gy per fraction.  2. The right breast was boosted to 10 Gy in 5 fractions at 2 Gy per fraction.     10/05/2016 -  Anti-estrogen oral therapy   Tamoxifen 20 mg daily      I have reviewed the data as listed CMP Latest Ref Rng & Units 06/19/2019 07/29/2018 12/28/2017  Glucose 65 - 99 mg/dL 97 93 97  BUN 7 - 25 mg/dL _0 Creatinine 0.50 - 1.10 mg/dL 0.63 0.89 0.80  Sodium 135 - 146 mmol/L 140 141 139  Potassium 3.5 - 5.3 mmol/L 4.1 4.1 4.3  Chloride 98 - 110 mmol/L 105 104 107  CO2 20 - 32 mmol/L _1 Calcium 8.6 - 10.2 mg/dL 9.4 9.4 9.8  Total Protein 6.1 - 8.1 g/dL 6.5 6.8 7.2  Total Bilirubin 0.2 - 1.2 mg/dL 0.4 0.5 0.4  Alkaline Phos 33 - 115 U/L - - -  AST 10 - 35 U/L _2 ALT 6 - 29 U/L _3 Lab Results  Component Value Date   WBC 4.1 06/19/2019   HGB 12.2 06/19/2019   HCT 36.1 06/19/2019   MCV 92.1 06/19/2019   PLT 227 06/19/2019   NEUTROABS 1,423 (L) 06/19/2019      Assessment Plan:  Breast cancer of upper-outer quadrant of right female breast ([place]) Right lumpectomy 07/01/2016: IDC grade 3, 0.5  cm, IgG DCIS, margins negative, 0/4 SLN negative, ER 95%, PR 95%, HER-2/neu negative, Ki-67 10%, T1 N0 stage IA Patient had started tamoxifen 20 mg daily 05/19/2016 because surgery was slightly delayed. Oncotype DX score 12, 8% risk of recurrence   Tamoxifen toxicities: Denies any hot flashes or myalgias.  Adj XRT 08/06/16 to 09/21/16   Treatment Plan: Adjuvant antiestrogen therapy with tamoxifen for 10 years  Hypothyroidism: On Armour Thyroid   Tamoxifen toxicities: Denies any hot flashes or myalgias.   Breast cancer surveillance: 1. Breast exams have been done by her gynecologist. 2. mammogram 05/13/2021: Benign breast  density category C   Return to clinic in 1 year for follow-up as a MyChart virtual visit    I discussed the assessment and treatment plan with the patient. The patient was provided an opportunity to ask questions and all were answered. The patient agreed with the plan and demonstrated an understanding of the instructions. The patient was advised to call back or seek an in-person evaluation if the symptoms worsen or if the condition fails to improve as anticipated.   Total time spent: 20 minutes including face-to-face MyChart video visit time and time spent for planning, charting and coordination of care  Rulon Eisenmenger, MD 06/06/2021  I, Thana Ates am acting as scribe for Nicholas Lose, MD.  I have reviewed the above documentation for accuracy and completeness, and I agree with the above.

## 2021-06-06 ENCOUNTER — Inpatient Hospital Stay: Payer: BC Managed Care – PPO | Attending: Hematology and Oncology | Admitting: Hematology and Oncology

## 2021-06-06 DIAGNOSIS — C50411 Malignant neoplasm of upper-outer quadrant of right female breast: Secondary | ICD-10-CM

## 2021-06-06 DIAGNOSIS — Z170 Estrogen receptor positive status [ER+]: Secondary | ICD-10-CM

## 2021-06-06 MED ORDER — TAMOXIFEN CITRATE 20 MG PO TABS: 20.0000 mg | ORAL_TABLET | Freq: Every day | ORAL | 3 refills | Status: DC

## 2021-06-06 NOTE — Assessment & Plan Note (Signed)
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 Oncotype DX score 12, 8% risk of recurrence  Tamoxifen toxicities:Denies any hot flashes or myalgias. Adj XRT 08/06/16 to 09/21/16  Treatment Plan: Adjuvant antiestrogen therapy with tamoxifen for 10 years Hypothyroidism: On Armour Thyroid  Tamoxifen toxicities: Denies any hot flashes or myalgias.  Breast cancer surveillance: 1.Breast examshave been done by her gynecologist. 2.mammogram6/28/2022: Benign breast density category C  Return to clinic in 1 year for follow-upas a MyChart virtual visit

## 2021-06-10 ENCOUNTER — Telehealth: Payer: Self-pay | Admitting: Hematology and Oncology

## 2021-06-10 NOTE — Telephone Encounter (Signed)
Scheduled per 7/22 los. Called and spoke with pt confirmed 7/24 appt

## 2021-09-23 DIAGNOSIS — E559 Vitamin D deficiency, unspecified: Secondary | ICD-10-CM | POA: Insufficient documentation

## 2022-01-14 ENCOUNTER — Encounter: Payer: Self-pay | Admitting: Internal Medicine

## 2022-03-11 ENCOUNTER — Ambulatory Visit (AMBULATORY_SURGERY_CENTER): Payer: BC Managed Care – PPO | Admitting: *Deleted

## 2022-03-11 VITALS — Ht 66.5 in | Wt 150.0 lb

## 2022-03-11 DIAGNOSIS — Z800 Family history of malignant neoplasm of digestive organs: Secondary | ICD-10-CM

## 2022-03-11 MED ORDER — NA SULFATE-K SULFATE-MG SULF 17.5-3.13-1.6 GM/177ML PO SOLN: 1.0000 | ORAL | 0 refills | Status: DC

## 2022-03-11 NOTE — Progress Notes (Signed)
Patient's pre-visit was done today over the phone with the patient. Name,DOB and address verified. Patient denies any allergies to Eggs and Soy. Patient denies any problems with anesthesia/sedation. Patient is not taking any diet pills or blood thinners. No home Oxygen. Insurance confirmed with patient. ? ?Prep instructions sent to pt's MyChart-pt is aware. Patient understands to call us back with any questions or concerns. Patient is aware of our care-partner policy.  ? ?EMMI education assigned to the patient for the procedure, sent to [facility].  ? ?

## 2022-03-30 ENCOUNTER — Other Ambulatory Visit: Payer: Self-pay | Admitting: Hematology and Oncology

## 2022-03-30 DIAGNOSIS — Z1231 Encounter for screening mammogram for malignant neoplasm of breast: Secondary | ICD-10-CM

## 2022-04-01 ENCOUNTER — Encounter: Payer: BC Managed Care – PPO | Admitting: Internal Medicine

## 2022-04-08 DIAGNOSIS — Z79899 Other long term (current) drug therapy: Secondary | ICD-10-CM | POA: Diagnosis not present

## 2022-04-08 DIAGNOSIS — R0602 Shortness of breath: Secondary | ICD-10-CM | POA: Diagnosis not present

## 2022-04-08 DIAGNOSIS — Z1389 Encounter for screening for other disorder: Secondary | ICD-10-CM | POA: Diagnosis not present

## 2022-04-08 DIAGNOSIS — E039 Hypothyroidism, unspecified: Secondary | ICD-10-CM | POA: Diagnosis not present

## 2022-04-08 DIAGNOSIS — Z1321 Encounter for screening for nutritional disorder: Secondary | ICD-10-CM | POA: Diagnosis not present

## 2022-04-08 DIAGNOSIS — Z131 Encounter for screening for diabetes mellitus: Secondary | ICD-10-CM | POA: Diagnosis not present

## 2022-04-08 DIAGNOSIS — Z0001 Encounter for general adult medical examination with abnormal findings: Secondary | ICD-10-CM | POA: Diagnosis not present

## 2022-04-08 DIAGNOSIS — Z13220 Encounter for screening for lipoid disorders: Secondary | ICD-10-CM | POA: Diagnosis not present

## 2022-04-09 DIAGNOSIS — R0602 Shortness of breath: Secondary | ICD-10-CM | POA: Diagnosis not present

## 2022-04-23 DIAGNOSIS — R0602 Shortness of breath: Secondary | ICD-10-CM | POA: Diagnosis not present

## 2022-04-23 DIAGNOSIS — R002 Palpitations: Secondary | ICD-10-CM | POA: Diagnosis not present

## 2022-04-23 DIAGNOSIS — U099 Post covid-19 condition, unspecified: Secondary | ICD-10-CM | POA: Diagnosis not present

## 2022-05-11 ENCOUNTER — Encounter: Payer: BC Managed Care – PPO | Admitting: Internal Medicine

## 2022-05-11 DIAGNOSIS — R0602 Shortness of breath: Secondary | ICD-10-CM | POA: Diagnosis not present

## 2022-05-14 ENCOUNTER — Ambulatory Visit
Admission: RE | Admit: 2022-05-14 | Discharge: 2022-05-14 | Disposition: A | Discharge status: Home / Self Care | Payer: BC Managed Care – PPO | Source: Ambulatory Visit | Attending: Hematology and Oncology | Admitting: Hematology and Oncology

## 2022-05-14 DIAGNOSIS — Z1231 Encounter for screening mammogram for malignant neoplasm of breast: Secondary | ICD-10-CM | POA: Diagnosis not present

## 2022-05-15 ENCOUNTER — Encounter: Payer: BC Managed Care – PPO | Admitting: Internal Medicine

## 2022-05-21 DIAGNOSIS — R0602 Shortness of breath: Secondary | ICD-10-CM | POA: Diagnosis not present

## 2022-06-05 NOTE — Progress Notes (Signed)
HEMATOLOGY-ONCOLOGY [place] VISIT PROGRESS NOTE  I connected with Dominique Murphy on 06/08/2022 at  2:00 PM EDT by Mychart video conference and verified that I am speaking with the correct person using two identifiers.  I discussed the limitations, risks, security and privacy concerns of performing an evaluation and management service by Webex and the availability of in person appointments.  I also discussed with the patient that there may be a patient responsible charge related to this service. The patient expressed understanding and agreed to proceed.  Patient's Location: Home Physician Location: Clinic  CHIEF COMPLIANT: Follow-up of right breast cancer on tamoxifen therapy  INTERVAL HISTORY: Dominique Murphy is a 51 y.o. female with above-mentioned history of  51 y.o. female with above-mentioned history of right breast cancer treated with lumpectomy and radiation, currently on antiestrogen therapy with tamoxifen. She presents to the clinic today for a MyChart follow-up.  She appears to be tolerating tamoxifen extremely well without any problems or concerns.  She has not had too many hot flashes or arthralgias or myalgias.  She noticed that the skin appears to be stretchy and sagging a little bit. Her husband recently had an acute MI and she is helping take care of him.  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  Genetic testing did not reveal a deleterious mutation.  Genes tested include: APC, ATM, AXIN2, BARD1, BMPR1A, BRCA1, BRCA2, BRIP1, CDH1, CDKN2A, CHEK2, DICER1, EPCAM, GREM1, KIT, MEN1,  MLH1, MSH2, MSH6, MUTYH, NBN, NF1, PALB2, PDGFRA, PMS2, POLD1, POLE, PTEN, RAD50, RAD51C, RAD51D, SDHA, SDHB, SDHC, SDHD, SMAD4, SMARCA4. STK11, TP53, TSC1, TSC2, and VHL.   07/01/2016 Surgery   Right lumpectomy: IDC grade 3, 0.5 cm, IgG DCIS, margins negative, 0/4 SLN negative, ER 95%, PR 95%, HER-2/neu negative, Ki-67 10%, T1 N0 stage IA   08/06/2016 - 09/21/2016 Radiation Therapy   Adj XRT [name]), 1. The right breast was treated to 50.4 Gy in 28 fractions at 1.8 Gy per fraction.  2. The right breast was boosted to 10 Gy in 5 fractions at 2 Gy per fraction.    10/05/2016 -  Anti-estrogen oral therapy   Tamoxifen 20 mg daily     REVIEW OF SYSTEMS:   Constitutional: Denies fevers, chills or abnormal weight loss   Breast: denies any pain or lumps or nodules in either breasts All other systems were reviewed with the patient and are negative.  Observations/Objective:  There were no vitals filed for this visit. There is no height or weight on file to calculate BMI.  I have reviewed the data as listed    Latest Ref Rng & Units 06/19/2019    8:54 AM 07/29/2018    8:45 AM 12/28/2017    9:08 AM  CMP  Glucose 65 - 99 mg/dL 97  93  97   BUN 7 - 25 mg/dL _0 Creatinine 0.50 - 1.10 mg/dL 0.63  0.89  0.80   Sodium 135 - 146 mmol/L 140  141  139   Potassium 3.5 - 5.3 mmol/L 4.1  4.1  4.3   Chloride 98 - 110 mmol/L 105  104  107   CO2 20 - 32 mmol/L _0 Calcium 8.6 - 10.2 mg/dL 9.4  9.4  9.8   Total Protein 6.1 - 8.1 g/dL 6.5  6.8  7.2   Total Bilirubin 0.2 - 1.2 mg/dL 0.4  0.5  0.4   AST 10 - 35 U/L _1 ALT 6 - 29 U/L _2 Lab Results  Component Value Date   WBC 4.1 06/19/2019   HGB 12.2 06/19/2019   HCT 36.1 06/19/2019   MCV 92.1 06/19/2019   PLT 227 06/19/2019   NEUTROABS 1,423 (L) 06/19/2019      Assessment Plan:  Breast cancer of upper-outer quadrant of right female breast ([place]) Right lumpectomy 07/01/2016: IDC grade 3, 0.5 cm, IgG DCIS,  margins negative, 0/4 SLN negative, ER 95%, PR 95%, HER-2/neu negative, Ki-67 10%, T1 N0 stage IA Patient had started tamoxifen 20 mg daily 05/19/2016 because surgery was slightly delayed. Oncotype DX score 12, 8% risk of recurrence   Tamoxifen toxicities: Denies any hot flashes or myalgias.  Adj XRT 08/06/16 to 09/21/16   Treatment Plan: Adjuvant antiestrogen therapy with tamoxifen for 10 years  Hypothyroidism: On Armour Thyroid   Tamoxifen toxicities: Denies any hot flashes or myalgias. Skin appears to be getting thinner and sagging. We would like to perform breast cancer index to determine if she would benefit from extended endocrine therapy. She may still decide to continue with the tamoxifen but at least she will have it for peace of mind sake.   Breast cancer surveillance: 1. Breast exams have been done by her gynecologist. 2. mammogram 05/15/2022: Benign breast density category C   Her husband recently had an acute myocardial infarction and she is helping taking care of him. I will call her with the results of [facility]. Return to clinic in 1 year for follow-up as a MyChart virtual visit   I discussed the assessment and treatment plan with the patient. The patient was provided an opportunity to ask questions and all were answered. The patient agreed with the plan and demonstrated an understanding of the instructions. The patient was advised to call back or seek an in-person evaluation if the symptoms worsen or if the condition fails to improve as anticipated.   I provided 12 minutes of face-to-face Web Ex time during this encounter.    Rulon Eisenmenger, MD 06/08/2022  I Gardiner Coins am scribing for Dr. Lindi Adie  I have reviewed the above documentation for accuracy and completeness, and I agree with the above.

## 2022-06-08 ENCOUNTER — Other Ambulatory Visit: Payer: Self-pay | Admitting: Hematology and Oncology

## 2022-06-08 ENCOUNTER — Inpatient Hospital Stay: Payer: BC Managed Care – PPO | Attending: Hematology and Oncology | Admitting: Hematology and Oncology

## 2022-06-08 ENCOUNTER — Encounter: Payer: Self-pay | Admitting: *Deleted

## 2022-06-08 DIAGNOSIS — Z170 Estrogen receptor positive status [ER+]: Secondary | ICD-10-CM

## 2022-06-08 DIAGNOSIS — C50411 Malignant neoplasm of upper-outer quadrant of right female breast: Secondary | ICD-10-CM | POA: Insufficient documentation

## 2022-06-08 DIAGNOSIS — Z923 Personal history of irradiation: Secondary | ICD-10-CM | POA: Insufficient documentation

## 2022-06-08 DIAGNOSIS — Z79810 Long term (current) use of selective estrogen receptor modulators (SERMs): Secondary | ICD-10-CM | POA: Insufficient documentation

## 2022-06-08 NOTE — Progress Notes (Signed)
Per MD request, RN successfully faxed BCI testing request [phone]).

## 2022-06-08 NOTE — Assessment & Plan Note (Signed)
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 Oncotype DX score 12, 8% risk of recurrence  Tamoxifen toxicities:Denies any hot flashes or myalgias. Adj XRT 08/06/16 to 09/21/16  Treatment Plan: Adjuvant antiestrogen therapy with tamoxifen for 10 years Hypothyroidism: On Armour Thyroid  Tamoxifen toxicities: Denies any hot flashes or myalgias.  Breast cancer surveillance: 1.Breast examshavebeen done by her gynecologist. 2.mammogram 05/15/2022: Benign breast density category C  Return to clinic in 1 year for follow-upas a MyChart virtual visit

## 2022-06-15 DIAGNOSIS — R0602 Shortness of breath: Secondary | ICD-10-CM | POA: Diagnosis not present

## 2022-06-17 ENCOUNTER — Encounter: Payer: Self-pay | Admitting: Internal Medicine

## 2022-06-24 ENCOUNTER — Telehealth: Payer: Self-pay | Admitting: *Deleted

## 2022-06-24 NOTE — Telephone Encounter (Signed)
Per MD request, RN placed call to pt regarding receint BCI testing showing no benefit from extended endocrine therapy. per MD pt can stop Tamoxifen at this time. pt educated and verbalized understanding.

## 2022-07-01 DIAGNOSIS — M25511 Pain in right shoulder: Secondary | ICD-10-CM | POA: Diagnosis not present

## 2022-07-06 ENCOUNTER — Encounter: Payer: Self-pay | Admitting: Internal Medicine

## 2022-07-09 ENCOUNTER — Ambulatory Visit: Payer: BC Managed Care – PPO

## 2022-07-09 VITALS — Ht 66.0 in | Wt 142.0 lb

## 2022-07-09 DIAGNOSIS — Z800 Family history of malignant neoplasm of digestive organs: Secondary | ICD-10-CM

## 2022-07-09 NOTE — Progress Notes (Signed)
No egg or soy allergy known to patient  No issues known to pt with past sedation with any surgeries or procedures Patient denies ever being told they had issues or difficulty with intubation  No FH of Malignant Hyperthermia Pt is not on diet pills Pt is not on  home 02  Pt is not on blood thinners  Pt denies issues with constipation  No A fib or A flutter Have any cardiac testing pending--denied Pt instructed to use [email or web address] or GoodRx for a price reduction on prep   PV over phone with instructions mailed to verified address; and sent via MyChart as well.  Copy of consent for review mailed. She already has Suprep, ordered earlier this year.

## 2022-07-14 DIAGNOSIS — M25511 Pain in right shoulder: Secondary | ICD-10-CM | POA: Diagnosis not present

## 2022-07-15 ENCOUNTER — Encounter: Payer: Self-pay | Admitting: Internal Medicine

## 2022-07-17 DIAGNOSIS — G4710 Hypersomnia, unspecified: Secondary | ICD-10-CM | POA: Diagnosis not present

## 2022-07-17 DIAGNOSIS — R0602 Shortness of breath: Secondary | ICD-10-CM | POA: Diagnosis not present

## 2022-07-27 ENCOUNTER — Encounter: Payer: Self-pay | Admitting: Internal Medicine

## 2022-07-30 ENCOUNTER — Encounter: Payer: Self-pay | Admitting: Internal Medicine

## 2022-07-30 ENCOUNTER — Ambulatory Visit (AMBULATORY_SURGERY_CENTER): Payer: BC Managed Care – PPO | Admitting: Internal Medicine

## 2022-07-30 VITALS — BP 139/73 | HR 58 | Temp 97.8°F | Resp 12 | Ht 66.5 in | Wt 142.0 lb

## 2022-07-30 DIAGNOSIS — Z800 Family history of malignant neoplasm of digestive organs: Secondary | ICD-10-CM | POA: Diagnosis not present

## 2022-07-30 DIAGNOSIS — Z1211 Encounter for screening for malignant neoplasm of colon: Secondary | ICD-10-CM

## 2022-07-30 MED ORDER — SODIUM CHLORIDE 0.9 % IV SOLN: 500.0000 mL | Freq: Once | INTRAVENOUS | Status: DC

## 2022-07-30 NOTE — Op Note (Signed)
[facility] Patient Name: Dominique Murphy Procedure Date: 07/30/2022 3:14 PM MRN: [id number] Endoscopist: Jerene Bears , MD Age: 51 Referring MD:  Date of Birth: 1971/01/24 Gender: Female Account #: [id number] Procedure:                Colonoscopy Indications:              Screening in patient at increased risk: Family                            history of 1st-degree relative with colorectal                            cancer before age 77 years, Last colonoscopy: May                            2018, 2013 (normal) Medicines:                Monitored Anesthesia Care Procedure:                Pre-Anesthesia Assessment:                           - Prior to the procedure, a History and Physical                            was performed, and patient medications and                            allergies were reviewed. The patient's tolerance of                            previous anesthesia was also reviewed. The risks                            and benefits of the procedure and the sedation                            options and risks were discussed with the patient.                            All questions were answered, and informed consent                            was obtained. Prior Anticoagulants: The patient has                            taken no previous anticoagulant or antiplatelet                            agents. ASA Grade Assessment: II - A patient with                            mild systemic disease. After reviewing the risks  and benefits, the patient was deemed in                            satisfactory condition to undergo the procedure.                           After obtaining informed consent, the colonoscope                            was passed under direct vision. Throughout the                            procedure, the patient's blood pressure, pulse, and                            oxygen saturations were monitored continuously. The                             PCF-HQ190L Colonoscope was introduced through the                            anus and advanced to the cecum, identified by                            appendiceal orifice and ileocecal valve. The                            colonoscopy was performed without difficulty. The                            patient tolerated the procedure well. The quality                            of the bowel preparation was good. The ileocecal                            valve, appendiceal orifice, and rectum were                            photographed. Scope In: 3:23:19 PM Scope Out: 3:39:27 PM Scope Withdrawal Time: 0 hours 11 minutes 21 seconds  Total Procedure Duration: 0 hours 16 minutes 8 seconds  Findings:                 The digital rectal exam was normal.                           The entire examined colon appeared normal on direct                            and retroflexion views. Complications:            No immediate complications. Estimated Blood Loss:     Estimated blood loss: none. Impression:               - The entire examined colon is  normal on direct and                            retroflexion views.                           - No specimens collected. Recommendation:           - Patient has a contact number available for                            emergencies. The signs and symptoms of potential                            delayed complications were discussed with the                            patient. Return to normal activities tomorrow.                            Written discharge instructions were provided to the                            patient.                           - Resume previous diet.                           - Continue present medications.                           - Repeat colonoscopy in 5 years for screening                            purposes. Jerene Bears, MD 07/30/2022 3:41:46 PM This report has been signed electronically.

## 2022-07-30 NOTE — Progress Notes (Signed)
GASTROENTEROLOGY PROCEDURE H&P NOTE   Primary Care Physician: Chesley Noon, MD    Reason for Procedure:  Family history of colon cancer in patient's father  Plan:    Colonoscopy  Patient is appropriate for endoscopic procedure(s) in the ambulatory ([facility]) setting.  The nature of the procedure, as well as the risks, benefits, and alternatives were carefully and thoroughly reviewed with the patient. Ample time for discussion and questions allowed. The patient understood, was satisfied, and agreed to proceed.     HPI: Dominique Murphy is a 52 y.o. female who presents for screening colonoscopy.  Medical history as below.  Tolerated the prep.  No recent chest pain or shortness of breath.  No abdominal pain today.  Past Medical History:  Diagnosis Date   Allergy    Breast cancer ([place]) 05/11/16   Right Breast    CIN I (cervical intraepithelial neoplasia I) 02/2013   LGSIL on colposcopic biopsy   High serum follicle stimulating hormone [facility]) 12/2016   Personal history of radiation therapy    Thyroid disease    Hypo thyroidism    Past Surgical History:  Procedure Laterality Date   ACETABULUM FRACTURE SURGERY  2002   BREAST BIOPSY     BREAST LUMPECTOMY Right 2017   BREAST LUMPECTOMY WITH RADIOACTIVE SEED AND SENTINEL LYMPH NODE BIOPSY Right 07/01/2016   Procedure: RIGHT BREAST LUMPECTOMY WITH RADIOACTIVE SEED X'S 2 AND SENTINEL LYMPH NODE BIOPSY;  Surgeon: Rolm Bookbinder, MD;  Location: [facility];  Service: General;  Laterality: Right;   CESAREAN SECTION  2000   COLONOSCOPY  04/01/2017   Dr.Jmichael Gille   COLPOSCOPY     MIRENA     Inserted 02/20/2014   WISDOM TOOTH EXTRACTION      Prior to Admission medications   Medication Sig Start Date End Date Taking? Authorizing Provider  ALPRAZolam Duanne Moron) 1 MG tablet Take 1 tablet (1 mg total) by mouth at bedtime as needed for anxiety. 01/12/20  Yes Joseph Pierini, MD  cholecalciferol (VITAMIN D3) 25 MCG (1000 UNIT)  tablet Take 1,000 Units by mouth daily.   Yes [provider]  tamoxifen (NOLVADEX) 10 MG tablet Take 10 mg by mouth 2 (two) times daily.   Yes [provider]  thyroid (ARMOUR THYROID) 60 MG tablet Take 1 tablet (60 mg total) by mouth in the morning and at bedtime. 06/11/20  Yes Nicholas Lose, MD  valACYclovir (VALTREX) 1000 MG tablet Take 2 pills at onset of outbreak and repeat in 12 hours 03/02/18   Fontaine, Belinda Block, MD    Current Outpatient Medications  Medication Sig Dispense Refill   ALPRAZolam (XANAX) 1 MG tablet Take 1 tablet (1 mg total) by mouth at bedtime as needed for anxiety. 30 tablet 2   cholecalciferol (VITAMIN D3) 25 MCG (1000 UNIT) tablet Take 1,000 Units by mouth daily.     tamoxifen (NOLVADEX) 10 MG tablet Take 10 mg by mouth 2 (two) times daily.     thyroid (ARMOUR THYROID) 60 MG tablet Take 1 tablet (60 mg total) by mouth in the morning and at bedtime.     valACYclovir (VALTREX) 1000 MG tablet Take 2 pills at onset of outbreak and repeat in 12 hours 20 tablet 2   Current Facility-Administered Medications  Medication Dose Route Frequency Provider Last Rate Last Admin   0.9 %  sodium chloride infusion  500 mL Intravenous Once Jorgen Wolfinger, Lajuan Lines, MD        Allergies as of 07/30/2022 -  Review Complete 07/30/2022  Allergen Reaction Noted   Morphine and related Nausea And Vomiting 01/19/2012   Percocet [oxycodone-acetaminophen] Itching 01/19/2012    Family History  Problem Relation Age of Onset   Colon cancer Father 57   Heart disease Maternal Grandfather    Skin cancer Sister 30   Heart disease Paternal Grandfather    Colon cancer Other    Esophageal cancer Neg Hx    Colon polyps Neg Hx    Rectal cancer Neg Hx    Stomach cancer Neg Hx     Social History   Socioeconomic History   Marital status: Married    Spouse name: Marya Amsler   Number of children: 1   Years of education: Not on file   Highest education level: Not on file  Occupational History    Occupation: Architectural technologist: [employer]  Tobacco Use   Smoking status: Never   Smokeless tobacco: Never  Vaping Use   Vaping Use: Never used  Substance and Sexual Activity   Alcohol use: Yes    Alcohol/week: 3.0 standard drinks of alcohol    Types: 3 Standard drinks or equivalent per week    Comment: socially   Drug use: No   Sexual activity: Yes    Birth control/protection: Post-menopausal    Comment: 1st intercourse 51 yo-Fewer than 5 partners  Other Topics Concern   Not on file  Social History Narrative   Not on file   Social Determinants of Health   Financial Resource Strain: Not on file  Food Insecurity: Not on file  Transportation Needs: Not on file  Physical Activity: Not on file  Stress: Not on file  Social Connections: Not on file  Intimate Partner Violence: Not on file    Physical Exam: Vital signs in last 24 hours: '@BP'$  92/60   Pulse 78   Temp 97.8 F (36.6 C) (Temporal)   Ht 5' 6.5" (1.689 m)   Wt 142 lb (64.4 kg)   SpO2 98%   BMI 22.58 kg/m  GEN: NAD EYE: Sclerae anicteric ENT: MMM CV: Non-tachycardic Pulm: CTA b/l GI: Soft, NT/ND NEURO:  Alert & Oriented x 3   Zenovia Jarred, MD [facility]  07/30/2022 3:15 PM

## 2022-07-30 NOTE — Patient Instructions (Signed)
Please read handouts provided. Continue present medications. Repeat colonoscopy in 5 years for screening.   YOU HAD AN ENDOSCOPIC PROCEDURE TODAY AT THE [facility]:   Refer to the procedure report that was given to you for any specific questions about what was found during the examination.  If the procedure report does not answer your questions, please call your gastroenterologist to clarify.  If you requested that your care partner not be given the details of your procedure findings, then the procedure report has been included in a sealed envelope for you to review at your convenience later.  YOU SHOULD EXPECT: Some feelings of bloating in the abdomen. Passage of more gas than usual.  Walking can help get rid of the air that was put into your GI tract during the procedure and reduce the bloating. If you had a lower endoscopy (such as a colonoscopy or flexible sigmoidoscopy) you may notice spotting of blood in your stool or on the toilet paper. If you underwent a bowel prep for your procedure, you may not have a normal bowel movement for a few days.  Please Note:  You might notice some irritation and congestion in your nose or some drainage.  This is from the oxygen used during your procedure.  There is no need for concern and it should clear up in a day or so.  SYMPTOMS TO REPORT IMMEDIATELY:  Following lower endoscopy (colonoscopy or flexible sigmoidoscopy):  Excessive amounts of blood in the stool  Significant tenderness or worsening of abdominal pains  Swelling of the abdomen that is new, acute  Fever of 100F or higher  For urgent or emergent issues, a gastroenterologist can be reached at any hour by calling [phone]. Do not use MyChart messaging for urgent concerns.    DIET:  We do recommend a small meal at first, but then you may proceed to your regular diet.  Drink plenty of fluids but you should avoid alcoholic beverages for 24 hours.  ACTIVITY:  You should plan  to take it easy for the rest of today and you should NOT DRIVE or use heavy machinery until tomorrow (because of the sedation medicines used during the test).    FOLLOW UP: Our staff will call the number listed on your records the next business day following your procedure.  We will call around 7:15- 8:00 am to check on you and address any questions or concerns that you may have regarding the information given to you following your procedure. If we do not reach you, we will leave a message.     If any biopsies were taken you will be contacted by phone or by letter within the next 1-3 weeks.  Please call us at [phone] if you have not heard about the biopsies in 3 weeks.    SIGNATURES/CONFIDENTIALITY: You and/or your care partner have signed paperwork which will be entered into your electronic medical record.  These signatures attest to the fact that that the information above on your After Visit Summary has been reviewed and is understood.  Full responsibility of the confidentiality of this discharge information lies with you and/or your care-partner. 

## 2022-07-30 NOTE — Progress Notes (Signed)
VS completed by DT.  Pt's states no medical or surgical changes since previsit or office visit.  

## 2022-07-30 NOTE — Progress Notes (Signed)
PT taken to PACU. Monitors in place. VSS. Report given to RN. 

## 2022-07-31 ENCOUNTER — Telehealth: Payer: Self-pay | Admitting: *Deleted

## 2022-07-31 NOTE — Telephone Encounter (Signed)
Post procedure follow up placed, no answer and left VM.

## 2022-09-09 ENCOUNTER — Encounter: Payer: BC Managed Care – PPO | Admitting: Internal Medicine

## 2022-11-26 ENCOUNTER — Encounter: Payer: Self-pay | Admitting: Hematology and Oncology

## 2023-01-27 LAB — LIPID PANEL
Cholesterol: 202 — AB (ref 0–200)
HDL: 86 — AB (ref 35–70)
LDL Cholesterol: 100
Triglycerides: 71 (ref 40–160)

## 2023-01-27 LAB — COMPREHENSIVE METABOLIC PANEL
Calcium: 9.5 (ref 8.7–10.7)
eGFR: 108

## 2023-01-27 LAB — BASIC METABOLIC PANEL
Glucose: 82
Sodium: 137 (ref 137–147)

## 2023-01-27 LAB — HEPATIC FUNCTION PANEL
ALT: 14 U/L (ref 7–35)
AST: 22 (ref 13–35)

## 2023-01-27 LAB — PROTEIN / CREATININE RATIO, URINE: Creatinine, Urine: 0.6

## 2023-01-27 LAB — VITAMIN B12: Vitamin B-12: 649

## 2023-01-27 LAB — VITAMIN D 25 HYDROXY (VIT D DEFICIENCY, FRACTURES): Vit D, 25-Hydroxy: 93

## 2023-01-27 LAB — CBC AND DIFFERENTIAL
Hemoglobin: 12.9 (ref 12.0–16.0)
Platelets: 237 10*3/uL (ref 150–400)
WBC: 4.8

## 2023-02-03 DIAGNOSIS — N760 Acute vaginitis: Secondary | ICD-10-CM | POA: Diagnosis not present

## 2023-02-03 DIAGNOSIS — N952 Postmenopausal atrophic vaginitis: Secondary | ICD-10-CM | POA: Diagnosis not present

## 2023-02-03 DIAGNOSIS — Z79810 Long term (current) use of selective estrogen receptor modulators (SERMs): Secondary | ICD-10-CM | POA: Diagnosis not present

## 2023-02-03 DIAGNOSIS — Z853 Personal history of malignant neoplasm of breast: Secondary | ICD-10-CM | POA: Diagnosis not present

## 2023-02-09 DIAGNOSIS — Z853 Personal history of malignant neoplasm of breast: Secondary | ICD-10-CM | POA: Diagnosis not present

## 2023-02-09 DIAGNOSIS — F5104 Psychophysiologic insomnia: Secondary | ICD-10-CM | POA: Diagnosis not present

## 2023-02-09 DIAGNOSIS — R3989 Other symptoms and signs involving the genitourinary system: Secondary | ICD-10-CM | POA: Diagnosis not present

## 2023-02-09 DIAGNOSIS — Z79899 Other long term (current) drug therapy: Secondary | ICD-10-CM | POA: Diagnosis not present

## 2023-02-19 ENCOUNTER — Other Ambulatory Visit: Payer: Self-pay | Admitting: Hematology and Oncology

## 2023-02-19 DIAGNOSIS — Z1231 Encounter for screening mammogram for malignant neoplasm of breast: Secondary | ICD-10-CM

## 2023-04-30 DIAGNOSIS — Z1330 Encounter for screening examination for mental health and behavioral disorders, unspecified: Secondary | ICD-10-CM | POA: Diagnosis not present

## 2023-04-30 DIAGNOSIS — B9689 Other specified bacterial agents as the cause of diseases classified elsewhere: Secondary | ICD-10-CM | POA: Diagnosis not present

## 2023-04-30 DIAGNOSIS — N898 Other specified noninflammatory disorders of vagina: Secondary | ICD-10-CM | POA: Diagnosis not present

## 2023-04-30 DIAGNOSIS — N760 Acute vaginitis: Secondary | ICD-10-CM | POA: Diagnosis not present

## 2023-04-30 DIAGNOSIS — B3731 Acute candidiasis of vulva and vagina: Secondary | ICD-10-CM | POA: Diagnosis not present

## 2023-06-02 ENCOUNTER — Ambulatory Visit
Admission: RE | Admit: 2023-06-02 | Discharge: 2023-06-02 | Disposition: A | Discharge status: Home / Self Care | Payer: BC Managed Care – PPO | Source: Ambulatory Visit | Attending: Hematology and Oncology | Admitting: Hematology and Oncology

## 2023-06-02 DIAGNOSIS — L821 Other seborrheic keratosis: Secondary | ICD-10-CM | POA: Diagnosis not present

## 2023-06-02 DIAGNOSIS — D225 Melanocytic nevi of trunk: Secondary | ICD-10-CM | POA: Diagnosis not present

## 2023-06-02 DIAGNOSIS — B078 Other viral warts: Secondary | ICD-10-CM | POA: Diagnosis not present

## 2023-06-02 DIAGNOSIS — Z1231 Encounter for screening mammogram for malignant neoplasm of breast: Secondary | ICD-10-CM | POA: Diagnosis not present

## 2023-06-02 DIAGNOSIS — L814 Other melanin hyperpigmentation: Secondary | ICD-10-CM | POA: Diagnosis not present

## 2023-06-03 ENCOUNTER — Other Ambulatory Visit: Payer: Self-pay | Admitting: Hematology and Oncology

## 2023-06-03 DIAGNOSIS — C50411 Malignant neoplasm of upper-outer quadrant of right female breast: Secondary | ICD-10-CM

## 2023-06-04 ENCOUNTER — Telehealth: Payer: Self-pay | Admitting: *Deleted

## 2023-06-04 NOTE — Telephone Encounter (Signed)
Received refill request for Tamoxifen which stated it was patient request. Per note in chart 06/24/22, MD advised patient could stop tamoxifen and patient was in agreement.  Contacted patient. She decided to continue Tamoxifen, but states she never called MD office to update on her decision. She would like to continue Tamoxifen. Noted patient's request on refill request and routed to MD.  She requests a follow up web visit with Dr. Pamelia Hoit - same as last year. Schedule message sent.

## 2023-06-08 ENCOUNTER — Telehealth: Payer: Self-pay | Admitting: Hematology and Oncology

## 2023-06-08 NOTE — Telephone Encounter (Signed)
Scheduled appointment per staff message. Patient is aware of the made appointment.

## 2023-06-18 LAB — TSH
TSH: 0.9 (ref 0.41–5.90)
TSH: 0.97 (ref 0.41–5.90)

## 2023-06-19 LAB — TSH: TSH: 2.8 (ref 0.41–5.90)

## 2023-06-29 ENCOUNTER — Inpatient Hospital Stay: Payer: BC Managed Care – PPO | Attending: Hematology and Oncology | Admitting: Hematology and Oncology

## 2023-06-29 DIAGNOSIS — Z170 Estrogen receptor positive status [ER+]: Secondary | ICD-10-CM | POA: Diagnosis not present

## 2023-06-29 DIAGNOSIS — Z79810 Long term (current) use of selective estrogen receptor modulators (SERMs): Secondary | ICD-10-CM | POA: Insufficient documentation

## 2023-06-29 DIAGNOSIS — C50411 Malignant neoplasm of upper-outer quadrant of right female breast: Secondary | ICD-10-CM | POA: Insufficient documentation

## 2023-06-29 DIAGNOSIS — Z923 Personal history of irradiation: Secondary | ICD-10-CM | POA: Insufficient documentation

## 2023-06-29 NOTE — Progress Notes (Signed)
HEMATOLOGY-ONCOLOGY WEBEX VISIT PROGRESS NOTE  I connected with Dominique Murphy on 06/29/2023 at  2:30 PM EDT by Mychart video conference and verified that I am speaking with the correct person using two identifiers.  I discussed the limitations, risks, security and privacy concerns of performing an evaluation and management service by Webex and the availability of in person appointments.  I also discussed with the patient that there may be a patient responsible charge related to this service. The patient expressed understanding and agreed to proceed.  Patient's Location: Home Physician Location: Clinic  CHIEF COMPLIANT: Follow-up on tamoxifen therapy  INTERVAL HISTORY: Dominique Murphy is a 52 y.o. female with history of right breast cancer who underwent lumpectomy radiation and is currently on tamoxifen.  She is tolerating tamoxifen extremely well.  She does have complaints regarding the skin feeling sagging etc. but overall she has no complaints.  Breast cancer index was performed which showed that she was low risk but she wanted to stay on tamoxifen for the full 10 years.  She denies any lumps or nodules in the breast.  She travels between [place] and [place].  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  Genetic testing did not reveal a deleterious mutation.  Genes tested include: APC, ATM, AXIN2, BARD1, BMPR1A, BRCA1, BRCA2, BRIP1, CDH1, CDKN2A, CHEK2, DICER1, EPCAM, GREM1, KIT, MEN1, MLH1, MSH2, MSH6, MUTYH, NBN, NF1, PALB2, PDGFRA, PMS2, POLD1, POLE, PTEN, RAD50, RAD51C, RAD51D,  SDHA, SDHB, SDHC, SDHD, SMAD4, SMARCA4. STK11, TP53, TSC1, TSC2, and VHL.   07/01/2016 Surgery   Right lumpectomy: IDC grade 3, 0.5 cm, IgG DCIS, margins negative, 0/4 SLN negative, ER 95%, PR 95%, HER-2/neu negative, Ki-67 10%, T1 N0 stage IA   08/06/2016 - 09/21/2016 Radiation Therapy   Adj XRT Mitzi Hansen), 1. The right breast was treated to 50.4 Gy in 28 fractions at 1.8 Gy per fraction.  2. The right breast was boosted to 10 Gy in 5 fractions at 2 Gy per fraction.    10/05/2016 -  Anti-estrogen oral therapy   Tamoxifen 20 mg daily     REVIEW OF SYSTEMS:   Constitutional: Denies fevers, chills or abnormal weight loss Breast: denies any pain or lumps or nodules in either breasts All other systems were reviewed with the patient and are negative.  Observations/Objective:  There were no vitals filed for this visit. There is no height or weight on file to calculate BMI.  I have reviewed the data as listed    Latest Ref Rng & Units 06/19/2019    8:54 AM 07/29/2018    8:45 AM 12/28/2017    9:08 AM  CMP  Glucose 65 - 99 mg/dL 97  93  97   BUN 7 - 25 mg/dL 11  15  16    Creatinine 0.50 - 1.10 mg/dL 0.96  0.45  4.09   Sodium 135 - 146 mmol/L 140  141  139   Potassium 3.5 - 5.3 mmol/L 4.1  4.1  4.3   Chloride 98 - 110 mmol/L 105  104  107   CO2 20 -  32 mmol/L 24  27  27    Calcium 8.6 - 10.2 mg/dL 9.4  9.4  9.8   Total Protein 6.1 - 8.1 g/dL 6.5  6.8  7.2   Total Bilirubin 0.2 - 1.2 mg/dL 0.4  0.5  0.4   AST 10 - 35 U/L 22  22  29    ALT 6 - 29 U/L 15  19  29      Lab Results  Component Value Date   WBC 4.1 06/19/2019   HGB 12.2 06/19/2019   HCT 36.1 06/19/2019   MCV 92.1 06/19/2019   PLT 227 06/19/2019   NEUTROABS 1,423 (L) 06/19/2019      Assessment Plan:  Breast cancer of upper-outer quadrant of right female breast (HCC) Right lumpectomy 07/01/2016: IDC grade 3, 0.5 cm, IgG DCIS, margins negative, 0/4 SLN negative, ER 95%, PR 95%, HER-2/neu negative, Ki-67 10%, T1 N0 stage  IA Patient had started tamoxifen 20 mg daily 05/19/2016 because surgery was slightly delayed. Oncotype DX score 12, 8% risk of recurrence   Tamoxifen toxicities: Denies any hot flashes or myalgias.  Adj XRT 08/06/16 to 09/21/16   Treatment Plan: Adjuvant antiestrogen therapy with tamoxifen for 10 years  Hypothyroidism: On Armour Thyroid   Tamoxifen toxicities: Denies any hot flashes or myalgias. Skin appears to be getting thinner and sagging.  BCI: Low risk. But she wants to stay for 10 years.  So she will continue with the same.   Breast cancer surveillance: 1. Breast exams have been done by her gynecologist. 2. mammogram 06/03/2023: Benign breast density category C   Her husband recently had an acute myocardial infarction and had a mechanical valve.  He is doing extremely well. She travels between [place] and [place]. Return to clinic in 1 year for follow-up as a MyChart virtual visit.   I discussed the assessment and treatment plan with the patient. The patient was provided an opportunity to ask questions and all were answered. The patient agreed with the plan and demonstrated an understanding of the instructions. The patient was advised to call back or seek an in-person evaluation if the symptoms worsen or if the condition fails to improve as anticipated.   I provided 12 minutes of face-to-face Web Ex time during this encounter.    Sabas Sous, MD 06/29/2023

## 2023-06-29 NOTE — Assessment & Plan Note (Signed)
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 Oncotype DX score 12, 8% risk of recurrence   Tamoxifen toxicities: Denies any hot flashes or myalgias.  Adj XRT 08/06/16 to 09/21/16   Treatment Plan: Adjuvant antiestrogen therapy with tamoxifen for 10 years  Hypothyroidism: On Armour Thyroid   Tamoxifen toxicities: Denies any hot flashes or myalgias. Skin appears to be getting thinner and sagging. We would like to perform breast cancer index to determine if she would benefit from extended endocrine therapy. She may still decide to continue with the tamoxifen but at least she will have it for peace of mind sake.   Breast cancer surveillance: 1. Breast exams have been done by her gynecologist. 2. mammogram 06/03/2023: Benign breast density category C   Her husband recently had an acute myocardial infarction and she is helping taking care of him. I will call her with the results of BCI. Return to clinic in 1 year for follow-up as a MyChart virtual visit

## 2023-08-16 ENCOUNTER — Encounter: Payer: Self-pay | Admitting: Family Medicine

## 2023-08-16 ENCOUNTER — Ambulatory Visit: Payer: BC Managed Care – PPO | Admitting: Family Medicine

## 2023-08-16 VITALS — BP 110/84 | HR 67 | Temp 98.1°F | Ht 66.5 in | Wt 142.0 lb

## 2023-08-16 DIAGNOSIS — T887XXA Unspecified adverse effect of drug or medicament, initial encounter: Secondary | ICD-10-CM

## 2023-08-16 DIAGNOSIS — Z800 Family history of malignant neoplasm of digestive organs: Secondary | ICD-10-CM

## 2023-08-16 DIAGNOSIS — Z170 Estrogen receptor positive status [ER+]: Secondary | ICD-10-CM

## 2023-08-16 DIAGNOSIS — C50411 Malignant neoplasm of upper-outer quadrant of right female breast: Secondary | ICD-10-CM | POA: Diagnosis not present

## 2023-08-16 DIAGNOSIS — F5104 Psychophysiologic insomnia: Secondary | ICD-10-CM

## 2023-08-16 DIAGNOSIS — R109 Unspecified abdominal pain: Secondary | ICD-10-CM

## 2023-08-16 DIAGNOSIS — E039 Hypothyroidism, unspecified: Secondary | ICD-10-CM | POA: Diagnosis not present

## 2023-08-16 LAB — TSH: TSH: 0.46 u[IU]/mL (ref 0.35–5.50)

## 2023-08-16 LAB — T4, FREE: Free T4: 0.61 ng/dL (ref 0.60–1.60)

## 2023-08-16 MED ORDER — SEMAGLUTIDE(0.25 OR 0.5MG/DOS) 2 MG/3ML ~~LOC~~ SOPN: PEN_INJECTOR | SUBCUTANEOUS | Status: DC

## 2023-08-17 ENCOUNTER — Encounter: Payer: Self-pay | Admitting: Family Medicine

## 2023-08-17 LAB — T3: T3, Total: 126 ng/dL (ref 76–181)

## 2023-08-17 NOTE — Progress Notes (Signed)
See mychart note Dear [name], Your lab values show your TSH is in a very good range at 0.46. Your free T4 and total T3 are in the low normal range. You may show these to your GYN.  It was good seeing you yesterday! Sincerely, Dr. Mardelle Matte

## 2023-08-17 NOTE — Progress Notes (Signed)
Subjective  CC:  Chief Complaint  Patient presents with   Establish Care   Hypothyroidism    HPI: Dominique Murphy is a 52 y.o. female who presents to [facility] at [place] today to establish care with me as a new patient.  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  She has a primary care doctor GYN in [place].  I reviewed multiple records.  Transferring care from [facility] due to her doctor decreasing his clinical time.  Last physical was March 2024.  I reviewed lab work from [place], August and March labs present.  They will be abstracted and scanned into chart.  She has the following concerns or needs: Hypothyroidism: Managed with Armour Thyroid.  Dosing has been adjusted recently to try to get controlled.  Most recent labs from August show TSH, free T3 and free T4 in the normal range, however dosage was decreased slightly for the weekends.  She is due for recheck.  She feels sluggish and tired. She has been treated for breast cancer of the right with lumpectomy and radiation.  She is completing 7 years of tamoxifen therapy however oncology feels she no longer needs it but is fine with her continuing it for total of 10 years.  She also had some genetic testing or some pharmacologic testing saying that tamoxifen is probably not needed for her cancer type.  She does complain of malaise, fatigue, poor sleep, chronic noninfectious vaginal discharge.  Mammogram is up-to-date.  I reviewed recent oncology notes. Poor sleep: Ongoing for many years but worse over the last 6 years.  Can go to sleep well but awakens.  Tends to be thinking and planning for her next day.  No mood disorders.  No stressors.  No history of depression.  She has used Ambien in the past but it did not last well.  She wonders about other medications.  Her husband falls asleep with the television on.  She will awaken and look at the clock.  Sleep  hygiene is variable. Family history of colon cancer her father at age 65.  She gets acute 5-year screens.  Will be due again next year.  She has had normal colonoscopies. Complains of several month history of nighttime abdominal cramping associated with need to defecate.  Symptoms improved afterwards.  Of note, about 12 to 18 months ago she started semaglutide for weight loss.  She has been successful.  Has lost about 25 pounds.  Has not been on it for about 3 weeks.  Compounding pharmacy, unsure of dose. She declines a flu shot today  Assessment  1. Acquired hypothyroidism   2. Malignant neoplasm of upper-outer quadrant of right breast in female, estrogen receptor positive (HCC)   3. Medication side effect   4. Psychophysiological insomnia   5. Family history of colon cancer   6. Abdominal cramping      Plan  Hypothyroidism: Education given.  Most recent lab testing shows normal results, thyroid medications have been adjusted down and patient is feeling the effects of this.  She is due for recheck.  We discussed goals of treatment. Medication side effects: Many of her complaints could be a combination of menopausal symptoms, thyroid symptoms, poor sleep, insomnia but also could be side effects from tamoxifen. Breast cancer treatment: Has completed treatment.  Since oncology feels she no longer needs tamoxifen I recommend stopping it and see if some of her symptoms improved.  She agrees Discussed sleep hygiene.  Will follow-up if further help is needed with insomnia.  Discussed sleep meditation, deep breathing exercises Abdominal cramping: Likely side effect from Ozempic.  Education given.  Weight is stable.  Follow up:  follow-up for complete physical in March Orders Placed This Encounter  Procedures   T3   T4, free   TSH   Meds ordered this encounter  Medications   Semaglutide,0.25 or 0.5MG /DOS, 2 MG/3ML SOPN    Sig: Compound formulation per GYN in [place]; uncertain dose.         08/16/2023   12:54 PM 11/04/2016   10:25 AM  Depression screen PHQ 2/9  Decreased Interest 0 0  Down, Depressed, Hopeless 0 0  PHQ - 2 Score 0 0    We updated and reviewed the patient's past history in detail and it is documented below.  Patient Active Problem List   Diagnosis Date Noted Date Diagnosed   Psychophysiological insomnia 04/24/2020     Priority: High   Hypothyroidism, acquired 11/16/2016     Priority: High    Managed by GYN in [place].    Breast cancer of upper-outer quadrant of right female breast [facility]) - 2017 05/15/2016     Priority: High    Dr. 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  The Hereditary Gene Panel offered by [facility] includes sequencing and/or deletion duplication testing of the following 42 genes: APC, ATM, AXIN2, BARD1, BMPR1A, BRCA1, BRCA2, BRIP1, CDH1, CDKN2A, CHEK2, DICER1, EPCAM, GREM1, KIT, MEN1, MLH1, MSH2, MSH6, MUTYH, NBN, NF1, PALB2, PDGFRA, PMS2, POLD1, POLE, PTEN, RAD50, RAD51C, RAD51D, SDHA, SDHB, SDHC, SDHD, SMAD4, SMARCA4. STK11, TP53, TSC1, TSC2, and VHL.  The report date is June 03, 2016.    Health Maintenance  Topic Date Due   HIV Screening  Never done   Hepatitis C Screening  Never done   COVID-19 Vaccine (1) 09/01/2023 (Originally 03/28/1976)   INFLUENZA VACCINE  02/14/2024 (Originally 06/17/2023)   MAMMOGRAM  06/01/2024   Cervical Cancer Screening (HPV/Pap Cotest)  06/22/2024   Colonoscopy  07/31/2027   DTaP/Tdap/Td (2 - Td or Tdap) 12/09/2029   Zoster Vaccines- Shingrix  Completed   HPV VACCINES  Aged Out   Immunization History  Administered Date(s) Administered   Tdap 12/10/2019   Zoster Recombinant(Shingrix) 07/21/2021,  08/01/2021, 12/07/2021   Current Meds  Medication Sig   ALPRAZolam (XANAX) 1 MG tablet Take 1 tablet (1 mg total) by mouth at bedtime as needed for anxiety.   cholecalciferol (VITAMIN D3) 25 MCG (1000 UNIT) tablet Take 1,000 Units by mouth daily.   Semaglutide,0.25 or 0.5MG /DOS, 2 MG/3ML SOPN Compound formulation per GYN in [place]; uncertain dose.   tamoxifen (NOLVADEX) 20 MG tablet TAKE 1 TABLET DAILY   thyroid (ARMOUR THYROID) 60 MG tablet Take 1 tablet (60 mg total) by mouth in the morning and at bedtime. (Patient taking differently: Take 60 mg by mouth in the morning and at bedtime. Saturday and Sunday)   thyroid (ARMOUR) 15 MG tablet Take 15 mg by mouth daily. Saturday and Sunday   thyroid (ARMOUR) 90 MG tablet Take 90 mg by mouth daily. M-F  Allergies: Patient is allergic to morphine and codeine and percocet [oxycodone-acetaminophen]. 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 Past Surgical History Patient  has a past surgical history that includes Acetabulum fracture surgery (2002); Colposcopy; Cesarean section (2000); Wisdom tooth extraction; MIRENA; Breast lumpectomy with radioactive seed and sentinel lymph node biopsy (Right, 07/01/2016); Colonoscopy (04/01/2017); Breast biopsy; Breast lumpectomy (Right, 2017); and Fracture surgery (2002). Family History: Patient family history includes Cancer in her father; Colon cancer in an other family member; Colon cancer (age of onset: 34) in her father; Heart disease in her maternal grandfather and paternal grandfather; Skin cancer (age of onset: 20) in her sister; Varicose Veins in her mother. Social History:  Patient  reports that she has never smoked. She has  never used smokeless tobacco. She reports that she does not currently use alcohol after a past usage of about 3.0 standard drinks of alcohol per week. She reports that she does not use drugs.  Review of Systems: Constitutional: negative for fever or malaise Ophthalmic: negative for photophobia, double vision or loss of vision Cardiovascular: negative for chest pain, dyspnea on exertion, or new LE swelling Respiratory: negative for SOB or persistent cough Gastrointestinal: negative for abdominal pain, change in bowel habits or melena Genitourinary: negative for dysuria or gross hematuria Musculoskeletal: negative for new gait disturbance or muscular weakness Integumentary: negative for new or persistent rashes Neurological: negative for TIA or stroke symptoms Psychiatric: negative for SI or delusions Allergic/Immunologic: negative for hives  Patient Care Team    Relationship Specialty Notifications Start End  Willow Ora, MD PCP - General Family Medicine  08/16/23   Emelia Loron, MD Consulting Physician General Surgery  02/12/17   Serena Croissant, MD Consulting Physician Hematology and Oncology  02/12/17   Loa Socks, NP Nurse Practitioner Hematology and Oncology  02/12/17   Dorothy Puffer, MD Consulting Physician Radiation Oncology  02/12/17   Beverley Fiedler, MD Consulting Physician Gastroenterology  08/16/23   Randolm Idol, MD Referring Physician Pulmonary Disease  08/16/23   Alda Berthold, MD Consulting Physician Cardiology  08/16/23   Noland Fordyce, MD Consulting Physician Obstetrics and Gynecology  08/16/23     Objective  Vitals: BP 110/84   Pulse 67   Temp 98.1 F (36.7 C)   Ht 5' 6.5" (1.689 m)   Wt 142 lb (64.4 kg)   SpO2 98%   BMI 22.58 kg/m  General:  Well developed, well nourished, no acute distress  Psych:  Alert and oriented,normal mood and affect HEENT:  Normocephalic, atraumatic, non-icteric sclera, supple neck  Cardiovascular:  RRR without gallop,  rub or murmur Respiratory:  Good breath sounds bilaterally, CTAB with normal respiratory effort Extremities: No edema  Commons side effects, risks, benefits, and alternatives for medications and treatment plan prescribed today were discussed, and the patient expressed understanding of the given instructions. Patient is instructed to call or message via MyChart if he/she has any questions or concerns regarding our treatment plan. No barriers to understanding were identified. We discussed Red Flag symptoms and signs in detail. Patient expressed understanding regarding what to do in case of urgent or emergency type symptoms.  Medication list was reconciled, printed and provided to the patient in AVS. Patient instructions and summary information was reviewed with the patient as  documented in the AVS. This note was prepared with assistance of Dragon voice recognition software. Occasional wrong-word or sound-a-like substitutions may have occurred due to the inherent limitations of voice recognition software  This visit occurred during the SARS-CoV-2 public health emergency.  Safety protocols were in place, including screening questions prior to the visit, additional usage of staff PPE, and extensive cleaning of exam room while observing appropriate contact time as indicated for disinfecting solutions.

## 2023-08-18 ENCOUNTER — Encounter: Payer: Self-pay | Admitting: Family Medicine

## 2023-08-19 ENCOUNTER — Encounter: Payer: Self-pay | Admitting: Family Medicine

## 2023-08-20 ENCOUNTER — Other Ambulatory Visit: Payer: Self-pay

## 2023-08-20 DIAGNOSIS — E039 Hypothyroidism, unspecified: Secondary | ICD-10-CM

## 2023-08-24 ENCOUNTER — Other Ambulatory Visit (INDEPENDENT_AMBULATORY_CARE_PROVIDER_SITE_OTHER): Payer: BC Managed Care – PPO

## 2023-08-24 DIAGNOSIS — E039 Hypothyroidism, unspecified: Secondary | ICD-10-CM | POA: Diagnosis not present

## 2023-08-24 LAB — T3, FREE: T3, Free: 3.9 pg/mL (ref 2.3–4.2)

## 2023-08-25 NOTE — Progress Notes (Signed)
See my chart note.

## 2023-09-27 ENCOUNTER — Encounter: Payer: Self-pay | Admitting: Family Medicine

## 2023-10-05 ENCOUNTER — Telehealth: Payer: BC Managed Care – PPO | Admitting: Family Medicine

## 2023-10-05 ENCOUNTER — Other Ambulatory Visit (HOSPITAL_BASED_OUTPATIENT_CLINIC_OR_DEPARTMENT_OTHER): Payer: Self-pay

## 2023-10-05 ENCOUNTER — Other Ambulatory Visit: Payer: Self-pay

## 2023-10-05 VITALS — Ht 66.5 in | Wt 145.0 lb

## 2023-10-05 DIAGNOSIS — Z8639 Personal history of other endocrine, nutritional and metabolic disease: Secondary | ICD-10-CM | POA: Diagnosis not present

## 2023-10-05 DIAGNOSIS — F5104 Psychophysiologic insomnia: Secondary | ICD-10-CM | POA: Diagnosis not present

## 2023-10-05 MED ORDER — ZEPBOUND 2.5 MG/0.5ML ~~LOC~~ SOAJ
2.5000 mg | SUBCUTANEOUS | 2 refills | Status: DC
  Filled 2023-10-05: qty 2, 28d supply, fill #0

## 2023-10-05 MED ORDER — TRAZODONE HCL 50 MG PO TABS
25.0000 mg | ORAL_TABLET | Freq: Every evening | ORAL | 3 refills | Status: DC | PRN
Start: 2023-10-05 — End: 2024-04-11

## 2023-10-05 MED ORDER — THYROID 60 MG PO TABS: 60.0000 mg | ORAL_TABLET | Freq: Every day | ORAL | Status: DC

## 2023-10-05 MED ORDER — TIRZEPATIDE-WEIGHT MANAGEMENT 2.5 MG/0.5ML ~~LOC~~ SOLN
2.5000 mg | SUBCUTANEOUS | 2 refills | Status: DC
  Filled 2023-10-05: qty 2, 28d supply, fill #0

## 2023-10-05 NOTE — Progress Notes (Signed)
Virtual Visit via Video Note  Subjective  CC:  Chief Complaint  Patient presents with   Insomnia    Pt stated that she has been dealing with insomnia for some time now and she is not taking any medications since she left the other practice and she has tried OTC and nothing is working     Weight Loss    I connected with [name] on 10/05/23 at  9:30 AM EST by a video enabled telemedicine application and verified that I am speaking with the correct person using two identifiers. Location patient: Home Location provider: [facility] [address], Office Persons participating in the virtual visit: Elyn Peers, MD patricia kennedy CMA  I discussed the limitations of evaluation and management by telemedicine and the availability of in person appointments. The patient expressed understanding and agreed to proceed. HPI: Dominique Murphy is a 52 y.o. female who was contacted today to address the problems listed above in the chief complaint. Insomnia: Long-term insomniac.  We stopped tamoxifen but this has made no changes or improvement in her sleep.  She reports she falls asleep easily but after several hours will awaken and has a very hard time going back to sleep.  She will often get on her phone and play games to try to go to sleep.  She will be up for hours at a time.  She does feel tired the next day.  She feels her brain keeps going, thinking about what she needs to do next day.  She has been on Xanax intermittently over the last 3 years to try to help calm these anxious feelings.  It does help her go back to sleep.  However, she struggles with this on a weekly basis and would like a better long-term plan.  No significant nocturia.  No mood problems.  She has tried meditations but has not found them helpful.  In the past she has used Ambien but felt it did not work well long-term.  She has not tried any other sleep medications.  No hot flashes.  She is  postmenopausal x 7 years.  Her husband does leave the TV on at night. Weight gain: She had been on semaglutide compounded formulation and has lost weight.  She has been off for the last 2 to 3 months and has regained 10 pounds.  She is struggling with this.  Her clothes are no longer fitting well.  She would like to restart medications if possible.  Her diet is healthy.  She tries exercise.  Assessment  1. Psychophysiological insomnia   2. History of obesity      Plan  Insomnia: We had a long conversation regarding sleep hygiene, etiologies for her sleep problems and ways to manage it.  We elect trazodone 25 to 50 mg p.o. nightly as needed.  She will work on sleep hygiene.  Stop Xanax Weight gain: Zepbound 2.5 mg weekly to be used for the next 1 to 2 months to see if we can get her back to her baseline.  Continue healthy diet I spent a total of 34 minutes for this patient encounter. Time spent included preparation, face-to-face counseling with the patient and coordination of care, review of chart and records, and documentation of the encounter.  I discussed the assessment and treatment plan with the patient. The patient was provided an opportunity to ask questions and all were answered. The patient agreed with the plan  and demonstrated an understanding of the instructions.   The patient was advised to call back or seek an in-person evaluation if the symptoms worsen or if the condition fails to improve as anticipated. Follow up: As needed Visit date not found  Meds ordered this encounter  Medications   thyroid (ARMOUR THYROID) 60 MG tablet    Sig: Take 1 tablet (60 mg total) by mouth daily before breakfast. On Saturday and Sundays   traZODone (DESYREL) 50 MG tablet    Sig: Take 0.5-1 tablets (25-50 mg total) by mouth at bedtime as needed for sleep.    Dispense:  90 tablet    Refill:  3   DISCONTD: tirzepatide (ZEPBOUND) 2.5 MG/0.5ML injection vial    Sig: Inject 2.5 mg into the skin once a  week.    Dispense:  2 mL    Refill:  2    Pt wants to self pay; please run with coupon, not through insurance   tirzepatide (ZEPBOUND) 2.5 MG/0.5ML Pen    Sig: Inject 2.5 mg into the skin once a week.    Dispense:  2 mL    Refill:  2    Please run with coupon for self pay; do not run through insurance.      I reviewed the patients updated PMH, FH, and SocHx.    Patient Active Problem List   Diagnosis Date Noted   Psychophysiological insomnia 04/24/2020    Priority: High   Hypothyroidism, acquired 11/16/2016    Priority: High   Breast cancer of upper-outer quadrant of right female breast [facility]) - 2017 05/15/2016    Priority: High   Family history of colon cancer 03/10/2012    Priority: High   Vitamin D deficiency 09/23/2021    Priority: Low   Genetic testing 06/04/2016    Priority: Low   Current Meds  Medication Sig   ALPRAZolam (XANAX) 1 MG tablet Take 1 tablet (1 mg total) by mouth at bedtime as needed for anxiety.   cholecalciferol (VITAMIN D3) 25 MCG (1000 UNIT) tablet Take 1,000 Units by mouth daily.   thyroid (ARMOUR) 90 MG tablet Take 90 mg by mouth daily. M-F   tirzepatide (ZEPBOUND) 2.5 MG/0.5ML Pen Inject 2.5 mg into the skin once a week.   traZODone (DESYREL) 50 MG tablet Take 0.5-1 tablets (25-50 mg total) by mouth at bedtime as needed for sleep.   valACYclovir (VALTREX) 1000 MG tablet Take 2 pills at onset of outbreak and repeat in 12 hours   [DISCONTINUED] Semaglutide,0.25 or 0.5MG /DOS, 2 MG/3ML SOPN Compound formulation per GYN in [place]; uncertain dose.   [DISCONTINUED] thyroid (ARMOUR THYROID) 60 MG tablet Take 1 tablet (60 mg total) by mouth in the morning and at bedtime. (Patient taking differently: Take 60 mg by mouth in the morning and at bedtime. Saturday and Sunday)   [DISCONTINUED] thyroid (ARMOUR) 15 MG tablet Take 15 mg by mouth daily. Saturday and Sunday   [DISCONTINUED] tirzepatide (ZEPBOUND) 2.5 MG/0.5ML injection vial Inject 2.5 mg into the skin once a  week.    Allergies: Patient is allergic to morphine and codeine and percocet [oxycodone-acetaminophen]. Family History: Patient family history includes Cancer in her father; Colon cancer in an other family member; Colon cancer (age of onset: 32) in her father; Heart disease in her maternal grandfather and paternal grandfather; Skin cancer (age of onset: 25) in her sister; Varicose Veins in her mother. Social History:  Patient  reports that she has never smoked. She has never used smokeless tobacco.  She reports that she does not currently use alcohol after a past usage of about 3.0 standard drinks of alcohol per week. She reports that she does not use drugs.  Review of Systems: Constitutional: Negative for fever malaise or anorexia Cardiovascular: negative for chest pain Respiratory: negative for SOB or persistent cough Gastrointestinal: negative for abdominal pain  OBJECTIVE Vitals: Ht 5' 6.5" (1.689 m)   Wt 145 lb (65.8 kg)   BMI 23.05 kg/m  General: no acute distress , A&Ox3  Willow Ora, MD

## 2023-10-19 MED ORDER — ZEPBOUND 5 MG/0.5ML ~~LOC~~ SOAJ: 5.0000 mg | SUBCUTANEOUS | 2 refills | Status: DC

## 2023-10-19 NOTE — Telephone Encounter (Signed)
This message has been routed to Dr. Mardelle Matte

## 2023-10-21 MED ORDER — ZEPBOUND 5 MG/0.5ML ~~LOC~~ SOAJ: 5.0000 mg | SUBCUTANEOUS | 2 refills | Status: DC

## 2023-10-21 NOTE — Addendum Note (Signed)
Addended by: Asencion Partridge on: 10/21/2023 01:47 PM   Modules accepted: Orders

## 2023-10-22 ENCOUNTER — Other Ambulatory Visit (HOSPITAL_COMMUNITY): Payer: Self-pay

## 2023-10-22 ENCOUNTER — Other Ambulatory Visit: Payer: Self-pay | Admitting: Family Medicine

## 2023-10-22 ENCOUNTER — Telehealth: Payer: Self-pay

## 2023-10-22 MED ORDER — ZEPBOUND 5 MG/0.5ML ~~LOC~~ SOAJ: 5.0000 mg | SUBCUTANEOUS | 2 refills | Status: DC

## 2023-10-22 MED ORDER — ZEPBOUND 7.5 MG/0.5ML ~~LOC~~ SOAJ: 7.5000 mg | SUBCUTANEOUS | 5 refills | Status: DC

## 2023-10-22 NOTE — Telephone Encounter (Signed)
Pharmacy Patient Advocate Encounter   Received notification from CoverMyMeds that prior authorization for Zepbound 5MG /0.5ML pen-injectors is required/requested.   Insurance verification completed.   The patient is insured through [employer] .   Per test claim: PA required; PA submitted to above mentioned insurance via CoverMyMeds Key/confirmation #/EOC [id number] Status is pending

## 2023-10-25 NOTE — Telephone Encounter (Signed)
Pharmacy Patient Advocate Encounter  Received notification from [employer] that Prior Authorization for Zepbound 5mg /0.18ml has been DENIED.  See denial reason below. No denial letter attached in CMM. Will attach denial letter to Media tab once received.   PA #/Case ID/Reference #: [id number]

## 2023-10-26 LAB — CBC AND DIFFERENTIAL
HCT: 41 (ref 36–46)
HCT: 41 (ref 36–46)
Hemoglobin: 13.5 (ref 12.0–16.0)
WBC: 4.8

## 2023-10-26 LAB — COMPREHENSIVE METABOLIC PANEL WITH GFR
Albumin: 4.4 (ref 3.5–5.0)
Calcium: 9.6 (ref 8.7–10.7)
eGFR: 97

## 2023-10-26 LAB — BASIC METABOLIC PANEL WITH GFR
BUN: 12 (ref 4–21)
CO2: 29 — AB (ref 13–22)
Chloride: 98 — AB (ref 99–108)
Creatinine: 0.7 (ref 0.5–1.1)
EGFR (Non-African Amer.): 97
Free T4: 0.9 ng/dL
Glucose: 81
Potassium: 3.9 meq/L (ref 3.5–5.1)
Sodium: 136 — AB (ref 137–147)
TSH: 1.33 (ref 0.41–5.90)

## 2023-10-26 LAB — HEPATIC FUNCTION PANEL
ALT: 22 U/L (ref 7–35)
AST: 24 (ref 13–35)
Alkaline Phosphatase: 78 (ref 25–125)

## 2023-10-26 LAB — LIPID PANEL
Cholesterol: 228 — AB (ref 0–200)
HDL: 97 — AB (ref 35–70)
LDL Cholesterol: 116
Triglycerides: 48 (ref 40–160)

## 2023-10-26 LAB — VITAMIN D 25 HYDROXY (VIT D DEFICIENCY, FRACTURES): Vit D, 25-Hydroxy: 93

## 2023-10-26 LAB — CBC: RBC: 4.4 (ref 3.87–5.11)

## 2024-01-07 ENCOUNTER — Other Ambulatory Visit: Payer: Self-pay | Admitting: Family Medicine

## 2024-01-07 DIAGNOSIS — Z1231 Encounter for screening mammogram for malignant neoplasm of breast: Secondary | ICD-10-CM

## 2024-01-17 ENCOUNTER — Other Ambulatory Visit (HOSPITAL_COMMUNITY): Payer: Self-pay

## 2024-02-11 ENCOUNTER — Other Ambulatory Visit (HOSPITAL_COMMUNITY): Payer: Self-pay

## 2024-02-29 LAB — BASIC METABOLIC PANEL WITH GFR
BUN: 11 (ref 4–21)
CO2: 30 — AB (ref 13–22)
Chloride: 100 (ref 99–108)
Creatinine: 0.6 (ref 0.5–1.1)
EGFR (Non-African Amer.): 106
Free T4: 1.1 ng/dL
Glucose: 78
Potassium: 3.8 meq/L (ref 3.5–5.1)
Sodium: 138 (ref 137–147)
TSH: 0.6 (ref 0.41–5.90)

## 2024-02-29 LAB — COMPREHENSIVE METABOLIC PANEL WITH GFR
Calcium: 9.8 (ref 8.7–10.7)
eGFR: 106

## 2024-02-29 LAB — CBC AND DIFFERENTIAL
HCT: 14 — AB (ref 36–46)
Hemoglobin: 13.5 (ref 12.0–16.0)
Neutrophils Absolute: 1193
Platelets: 253 10*3/uL (ref 150–400)
WBC: 3.8

## 2024-02-29 LAB — HEPATIC FUNCTION PANEL
ALT: 14 U/L (ref 7–35)
AST: 20 (ref 13–35)

## 2024-02-29 LAB — LIPID PANEL
Cholesterol: 228 — AB (ref 0–200)
HDL: 104 — AB (ref 35–70)
LDL Cholesterol: 109
Triglycerides: 66 (ref 40–160)

## 2024-02-29 LAB — TSH: TSH: 0.6 (ref 0.41–5.90)

## 2024-03-03 ENCOUNTER — Telehealth: Payer: Self-pay

## 2024-03-03 ENCOUNTER — Other Ambulatory Visit (HOSPITAL_COMMUNITY): Payer: Self-pay

## 2024-03-03 NOTE — Telephone Encounter (Signed)
 Pharmacy Patient Advocate Encounter   Received notification from Patient Pharmacy that prior authorization for Zepbound  5 is required/requested.   Insurance verification completed.   The patient is insured through [employer] .   Per test claim: PA required; PA submitted to above mentioned insurance via CoverMyMeds Key/confirmation #/EOC [id number] Status is pending

## 2024-03-03 NOTE — Telephone Encounter (Signed)
 Pharmacy Patient Advocate Encounter  Received notification from [employer] that Prior Authorization for Zepbound  5 has been APPROVED from 03/03/24 to 03/03/25. Ran test claim, Copay is $30.00. This test claim was processed through [facility]- copay amounts may vary at other pharmacies due to pharmacy/plan contracts, or as the patient moves through the different stages of their insurance plan.   PA #/Case ID/Reference #: Pat Bonier

## 2024-03-28 ENCOUNTER — Encounter: Payer: Self-pay | Admitting: Family Medicine

## 2024-04-03 DIAGNOSIS — N390 Urinary tract infection, site not specified: Secondary | ICD-10-CM | POA: Diagnosis not present

## 2024-04-03 DIAGNOSIS — Z1331 Encounter for screening for depression: Secondary | ICD-10-CM | POA: Diagnosis not present

## 2024-04-03 DIAGNOSIS — N898 Other specified noninflammatory disorders of vagina: Secondary | ICD-10-CM | POA: Diagnosis not present

## 2024-04-03 DIAGNOSIS — Z01419 Encounter for gynecological examination (general) (routine) without abnormal findings: Secondary | ICD-10-CM | POA: Diagnosis not present

## 2024-04-03 DIAGNOSIS — N952 Postmenopausal atrophic vaginitis: Secondary | ICD-10-CM | POA: Diagnosis not present

## 2024-04-03 DIAGNOSIS — Z124 Encounter for screening for malignant neoplasm of cervix: Secondary | ICD-10-CM | POA: Diagnosis not present

## 2024-04-04 ENCOUNTER — Ambulatory Visit: Admitting: Family Medicine

## 2024-04-05 DIAGNOSIS — R051 Acute cough: Secondary | ICD-10-CM | POA: Diagnosis not present

## 2024-04-05 DIAGNOSIS — J029 Acute pharyngitis, unspecified: Secondary | ICD-10-CM | POA: Diagnosis not present

## 2024-04-05 DIAGNOSIS — B9689 Other specified bacterial agents as the cause of diseases classified elsewhere: Secondary | ICD-10-CM | POA: Diagnosis not present

## 2024-04-05 DIAGNOSIS — J988 Other specified respiratory disorders: Secondary | ICD-10-CM | POA: Diagnosis not present

## 2024-04-06 MED ORDER — FLUCONAZOLE 150 MG PO TABS: ORAL_TABLET | ORAL | 1 refills | Status: DC

## 2024-04-10 ENCOUNTER — Encounter: Payer: Self-pay | Admitting: Family Medicine

## 2024-04-11 ENCOUNTER — Encounter: Payer: Self-pay | Admitting: Family Medicine

## 2024-04-11 ENCOUNTER — Telehealth (INDEPENDENT_AMBULATORY_CARE_PROVIDER_SITE_OTHER): Admitting: Family Medicine

## 2024-04-11 VITALS — Ht 66.5 in | Wt 143.0 lb

## 2024-04-11 DIAGNOSIS — B3731 Acute candidiasis of vulva and vagina: Secondary | ICD-10-CM

## 2024-04-11 DIAGNOSIS — B9689 Other specified bacterial agents as the cause of diseases classified elsewhere: Secondary | ICD-10-CM

## 2024-04-11 DIAGNOSIS — J208 Acute bronchitis due to other specified organisms: Secondary | ICD-10-CM | POA: Diagnosis not present

## 2024-04-11 DIAGNOSIS — Z8639 Personal history of other endocrine, nutritional and metabolic disease: Secondary | ICD-10-CM

## 2024-04-11 DIAGNOSIS — N3000 Acute cystitis without hematuria: Secondary | ICD-10-CM

## 2024-04-11 NOTE — Progress Notes (Signed)
 Virtual Visit via Video Note  Subjective  CC:  Chief Complaint  Patient presents with   Medication concerns     I connected with Dominique Murphy on 04/11/24 at 10:30 AM EDT by a video enabled telemedicine application and verified that I am speaking with the correct person using two identifiers. Location patient: Home Location provider: [facility] [address] Office Persons participating in the virtual visit: Dominique Murphy, Marzo, MD Dominique Murphy CMA  I discussed the limitations of evaluation and management by telemedicine and the availability of in person appointments. The patient expressed understanding and agreed to proceed. HPI: Dominique Murphy is a 53 y.o. female who was contacted today to address the problems listed above in the chief complaint. History of obesity; has been on semaglutide  from compounding pharmacy in [place]  for weight mgt/maintenance. Has done well. Now pharmacy has RX for zepbound  ready for $25 copay. Should be ready tomorrow. Appetite is controlled and weight is stable on maintenance dosing. No adverse effects.  Recovering from uti, bronchitis and yeast infection.  Had pap las week. Will request records from dr. Johnston Nao  Assessment  1. History of obesity   2. Acute bacterial bronchitis   3. Yeast vaginitis   4. Acute cystitis without hematuria      Plan  History of obesity:  weight is now at goal and controlled. Will maintain on glp-1.  Completing course of abx. Still with cough.  Yeast vag resolved.  Monitor for recurrent uti sxs. I discussed the assessment and treatment plan with the patient. The patient was provided an opportunity to ask questions and all were answered. The patient agreed with the plan and demonstrated an understanding of the instructions.   The patient was advised to call back or seek an in-person evaluation if the symptoms worsen or if the condition fails to improve as anticipated. Follow up:  prn Visit date not found  No orders of the defined types were placed in this encounter.     I reviewed the patients updated PMH, FH, and SocHx.    Patient Active Problem List   Diagnosis Date Noted   Psychophysiological insomnia 04/24/2020    Priority: High   Hypothyroidism, acquired 11/16/2016    Priority: High   Breast cancer of upper-outer quadrant of right female breast [facility]) - 2017 05/15/2016    Priority: High   Family history of colon cancer 03/10/2012    Priority: High   Vitamin D  deficiency 09/23/2021    Priority: Low   Genetic testing 06/04/2016    Priority: Low   Current Meds  Medication Sig   amoxicillin -clavulanate (AUGMENTIN ) 875-125 MG tablet Take 1 tablet by mouth.   cholecalciferol (VITAMIN D3) 25 MCG (1000 UNIT) tablet Take 1,000 Units by mouth daily.   fluconazole  (DIFLUCAN ) 150 MG tablet Take one tablet today; may repeat in 3 days if symptoms persist   thyroid  (ARMOUR) 90 MG tablet Take 90 mg by mouth daily. M-F   valACYclovir  (VALTREX ) 1000 MG tablet Take 2 pills at onset of outbreak and repeat in 12 hours    Allergies: Patient is allergic to morphine and codeine  and percocet [oxycodone-acetaminophen]. Family History: Patient family history includes Cancer in her father; Colon cancer in an other family member; Colon cancer (age of onset: 56) in her father; Heart disease in her maternal grandfather and paternal grandfather; Skin cancer (age of onset: 75) in her sister; Varicose Veins in her mother. Social History:  Patient  reports that she has never smoked. She has never used smokeless tobacco. She reports that she does not currently use alcohol after a past usage of about 3.0 standard drinks of alcohol per week. She reports that she does not use drugs.  Review of Systems: Constitutional: Negative for fever malaise or anorexia Cardiovascular: negative for chest pain Respiratory: negative for SOB or persistent cough Gastrointestinal: negative for  abdominal pain  OBJECTIVE Vitals: Ht 5' 6.5" (1.689 m)   Wt 143 lb (64.9 kg)   BMI 22.74 kg/m  General: no acute distress , A&Ox3  Dominique Saha, MD

## 2024-04-11 NOTE — Telephone Encounter (Signed)
 Noted

## 2024-04-17 ENCOUNTER — Other Ambulatory Visit: Payer: Self-pay

## 2024-04-17 ENCOUNTER — Encounter: Payer: Self-pay | Admitting: Family Medicine

## 2024-04-17 MED ORDER — AMOXICILLIN-POT CLAVULANATE 875-125 MG PO TABS: 1.0000 | ORAL_TABLET | Freq: Two times a day (BID) | ORAL | 0 refills | Status: AC

## 2024-04-21 ENCOUNTER — Encounter: Payer: Self-pay | Admitting: Family Medicine

## 2024-04-21 NOTE — Telephone Encounter (Signed)
 Noted

## 2024-06-07 ENCOUNTER — Ambulatory Visit: Payer: BC Managed Care – PPO

## 2024-06-21 ENCOUNTER — Encounter: Payer: Self-pay | Admitting: Family Medicine

## 2024-06-21 MED ORDER — ZEPBOUND 12.5 MG/0.5ML ~~LOC~~ SOAJ: 12.5000 mg | SUBCUTANEOUS | 5 refills | Status: DC

## 2024-06-27 ENCOUNTER — Ambulatory Visit

## 2024-06-27 ENCOUNTER — Ambulatory Visit
Admission: RE | Admit: 2024-06-27 | Discharge: 2024-06-27 | Disposition: A | Discharge status: Home / Self Care | Source: Ambulatory Visit | Attending: Family Medicine | Admitting: Family Medicine

## 2024-06-27 DIAGNOSIS — Z1231 Encounter for screening mammogram for malignant neoplasm of breast: Secondary | ICD-10-CM | POA: Diagnosis not present

## 2024-07-11 DIAGNOSIS — L821 Other seborrheic keratosis: Secondary | ICD-10-CM | POA: Diagnosis not present

## 2024-07-11 DIAGNOSIS — D225 Melanocytic nevi of trunk: Secondary | ICD-10-CM | POA: Diagnosis not present

## 2024-07-11 DIAGNOSIS — L814 Other melanin hyperpigmentation: Secondary | ICD-10-CM | POA: Diagnosis not present

## 2024-07-11 DIAGNOSIS — B078 Other viral warts: Secondary | ICD-10-CM | POA: Diagnosis not present

## 2024-08-09 ENCOUNTER — Encounter: Payer: Self-pay | Admitting: Family Medicine

## 2024-08-09 MED ORDER — TRAZODONE HCL 100 MG PO TABS: 100.0000 mg | ORAL_TABLET | Freq: Every day | ORAL | 3 refills | Status: AC

## 2024-08-11 ENCOUNTER — Other Ambulatory Visit: Payer: Self-pay | Admitting: Family Medicine

## 2024-08-11 MED ORDER — ZEPBOUND 12.5 MG/0.5ML ~~LOC~~ SOAJ: 12.5000 mg | SUBCUTANEOUS | 1 refills | Status: DC

## 2024-09-26 ENCOUNTER — Other Ambulatory Visit: Payer: Self-pay

## 2024-09-26 ENCOUNTER — Encounter: Payer: Self-pay | Admitting: Family Medicine

## 2024-09-26 MED ORDER — ZEPBOUND 12.5 MG/0.5ML ~~LOC~~ SOAJ: 12.5000 mg | SUBCUTANEOUS | 1 refills | Status: DC

## 2024-09-26 MED ORDER — ZEPBOUND 12.5 MG/0.5ML ~~LOC~~ SOAJ: 12.5000 mg | SUBCUTANEOUS | 1 refills | Status: AC

## 2024-10-30 LAB — LIPID PANEL
Cholesterol: 215 — AB (ref 0–200)
HDL: 92 — AB (ref 35–70)
LDL Cholesterol: 109
Triglycerides: 47 (ref 40–160)

## 2024-10-30 LAB — BASIC METABOLIC PANEL WITH GFR
BUN: 13 (ref 4–21)
Chloride: 98 — AB (ref 99–108)
Creatinine: 0.6 (ref 0.5–1.1)
Glucose: 87
Potassium: 3.9 meq/L (ref 3.5–5.1)
Sodium: 135 — AB (ref 137–147)

## 2024-10-30 LAB — VITAMIN B12: Vitamin B-12: 547

## 2024-10-30 LAB — COMPREHENSIVE METABOLIC PANEL WITH GFR
Albumin: 4.6 (ref 3.5–5.0)
Calcium: 9.6 (ref 8.7–10.7)
Globulin: 2.5
eGFR: 105

## 2024-10-30 LAB — VITAMIN D 25 HYDROXY (VIT D DEFICIENCY, FRACTURES): Vit D, 25-Hydroxy: 132

## 2024-10-30 LAB — TSH: TSH: 0.38 — AB (ref 0.41–5.90)

## 2024-10-31 LAB — LAB REPORT - SCANNED
EGFR: 105
Free T4: 1.4 ng/dL
TSH: 0.38 — AB (ref 0.41–5.90)

## 2024-11-29 ENCOUNTER — Encounter: Payer: Self-pay | Admitting: Family Medicine

## 2024-11-29 DIAGNOSIS — B9689 Other specified bacterial agents as the cause of diseases classified elsewhere: Secondary | ICD-10-CM

## 2024-11-29 MED ORDER — AZITHROMYCIN 250 MG PO TABS: ORAL_TABLET | ORAL | 0 refills | Status: AC

## 2024-11-29 MED ORDER — FLUCONAZOLE 150 MG PO TABS: ORAL_TABLET | ORAL | 1 refills | Status: AC

## 2024-11-29 NOTE — Telephone Encounter (Signed)
 A total of 5 minutes were spent by me to personally review the patient-generated inquiry, review patient records and data pertinent to assessment of the patient's problem, develop a management plan including generation of prescriptions and/or orders, and on subsequent communication with the patient through secure the MyChart portal service. There is no separately reported E/M service related to this service in the past 7 days nor does the patient have an upcoming soonest available appointment for this issue. This work was completed in less than 7 days.

## 2024-12-05 ENCOUNTER — Encounter: Payer: Self-pay | Admitting: Family Medicine
